# Patient Record
Sex: Female | Born: 1985 | Race: White | Hispanic: No | Smoking: Former smoker
Health system: Southern US, Community
[De-identification: ages and names within clinical notes are randomized; demographics above are authoritative.]

## PROBLEM LIST (undated history)

## (undated) DIAGNOSIS — R12 Heartburn: Secondary | ICD-10-CM

## (undated) DIAGNOSIS — F909 Attention-deficit hyperactivity disorder, unspecified type: Secondary | ICD-10-CM

## (undated) DIAGNOSIS — F32A Depression, unspecified: Secondary | ICD-10-CM

## (undated) DIAGNOSIS — F419 Anxiety disorder, unspecified: Secondary | ICD-10-CM

## (undated) DIAGNOSIS — O26899 Other specified pregnancy related conditions, unspecified trimester: Secondary | ICD-10-CM

## (undated) DIAGNOSIS — F329 Major depressive disorder, single episode, unspecified: Secondary | ICD-10-CM

## (undated) DIAGNOSIS — Z808 Family history of malignant neoplasm of other organs or systems: Secondary | ICD-10-CM

## (undated) DIAGNOSIS — T4145XA Adverse effect of unspecified anesthetic, initial encounter: Secondary | ICD-10-CM

## (undated) DIAGNOSIS — Z1509 Genetic susceptibility to other malignant neoplasm: Secondary | ICD-10-CM

## (undated) DIAGNOSIS — IMO0002 Reserved for concepts with insufficient information to code with codable children: Secondary | ICD-10-CM

## (undated) DIAGNOSIS — Z803 Family history of malignant neoplasm of breast: Secondary | ICD-10-CM

## (undated) DIAGNOSIS — T8859XA Other complications of anesthesia, initial encounter: Secondary | ICD-10-CM

## (undated) HISTORY — DX: Family history of malignant neoplasm of breast: Z80.3

## (undated) HISTORY — DX: Genetic susceptibility to other malignant neoplasm: Z15.09

## (undated) HISTORY — DX: Attention-deficit hyperactivity disorder, unspecified type: F90.9

## (undated) HISTORY — DX: Reserved for concepts with insufficient information to code with codable children: IMO0002

## (undated) HISTORY — DX: Depression, unspecified: F32.A

## (undated) HISTORY — PX: LEEP: SHX91

## (undated) HISTORY — DX: Family history of malignant neoplasm of other organs or systems: Z80.8

## (undated) HISTORY — DX: Major depressive disorder, single episode, unspecified: F32.9

## (undated) HISTORY — PX: CERVICAL CONE BIOPSY: SUR198

---
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
## 2012-05-02 LAB — OB RESULTS CONSOLE TSH: TSH: 0.485

## 2012-05-02 LAB — OB RESULTS CONSOLE ANTIBODY SCREEN: Antibody Screen: NEGATIVE

## 2012-05-02 LAB — OB RESULTS CONSOLE PLATELET COUNT: Platelets: 323 10*3/uL

## 2012-05-02 LAB — OB RESULTS CONSOLE GC/CHLAMYDIA: Gonorrhea: NEGATIVE

## 2012-05-02 LAB — OB RESULTS CONSOLE RUBELLA ANTIBODY, IGM: Rubella: IMMUNE

## 2012-09-12 ENCOUNTER — Encounter: Payer: Self-pay | Admitting: Obstetrics

## 2012-09-12 ENCOUNTER — Encounter: Payer: Self-pay | Admitting: Obstetrics & Gynecology

## 2012-09-12 ENCOUNTER — Ambulatory Visit (INDEPENDENT_AMBULATORY_CARE_PROVIDER_SITE_OTHER): Admitting: Obstetrics

## 2012-09-12 VITALS — BP 118/82 | Temp 98.6°F | Ht 61.0 in | Wt 235.6 lb

## 2012-09-12 DIAGNOSIS — Z3201 Encounter for pregnancy test, result positive: Secondary | ICD-10-CM

## 2012-09-12 DIAGNOSIS — Z3483 Encounter for supervision of other normal pregnancy, third trimester: Secondary | ICD-10-CM

## 2012-09-12 LAB — POCT URINALYSIS DIPSTICK
Ketones, UA: NEGATIVE
Protein, UA: NEGATIVE
Spec Grav, UA: 1.01

## 2012-09-12 NOTE — Progress Notes (Signed)
Pulse- 98 . Subjective:    Kathleen Osborne is being seen today for her first obstetrical visit.  This is not a planned pregnancy. She is at [redacted]w[redacted]d gestation. Her obstetrical history is significant for obesity. Relationship with FOB: spouse, living together. Patient does intend to breast feed. Pregnancy history fully reviewed.  Menstrual History: OB History   Grav Para Term Preterm Abortions TAB SAB Ect Mult Living   2 1 1       1       Menarche age: 27 Patient's last menstrual period was 01/24/2012.    The following portions of the patient's history were reviewed and updated as appropriate: allergies, current medications, past family history, past medical history, past social history, past surgical history and problem list.  Review of Systems Pertinent items are noted in HPI.    Objective:    General appearance: alert and no distress Abdomen: normal findings: soft, non-tender Pelvic: cervix normal in appearance, external genitalia normal, no adnexal masses or tenderness, no cervical motion tenderness, vagina normal without discharge and uterus enlarged, soft, NT.    Assessment:    Pregnancy at [redacted]w[redacted]d weeks    Plan:    Initial labs drawn. Prenatal vitamins. Problem list reviewed and updated. AFP3 discussed: Already done. Role of ultrasound in pregnancy discussed; fetal survey: Done. Amniocentesis discussed: not indicated. Follow up in 2 weeks. 50% of 20 min visit spent on counseling and coordination of care.

## 2012-09-12 NOTE — Addendum Note (Signed)
Addended by: Elby Beck F on: 09/12/2012 11:00 PM   Modules accepted: Orders

## 2012-09-13 ENCOUNTER — Encounter: Payer: Self-pay | Admitting: Obstetrics & Gynecology

## 2012-09-27 ENCOUNTER — Ambulatory Visit (INDEPENDENT_AMBULATORY_CARE_PROVIDER_SITE_OTHER): Admitting: Obstetrics

## 2012-09-27 VITALS — BP 112/75 | Temp 97.9°F | Wt 237.0 lb

## 2012-09-27 DIAGNOSIS — Z3483 Encounter for supervision of other normal pregnancy, third trimester: Secondary | ICD-10-CM

## 2012-09-27 DIAGNOSIS — Z3480 Encounter for supervision of other normal pregnancy, unspecified trimester: Secondary | ICD-10-CM

## 2012-09-27 LAB — POCT URINALYSIS DIPSTICK
Bilirubin, UA: NEGATIVE
Glucose, UA: NEGATIVE
Leukocytes, UA: NEGATIVE
Nitrite, UA: NEGATIVE

## 2012-09-27 NOTE — Progress Notes (Signed)
Pulse 109  Patient states she has no concerns.

## 2012-09-28 ENCOUNTER — Encounter: Payer: Self-pay | Admitting: Obstetrics

## 2012-09-29 LAB — STREP B DNA PROBE: GBSP: NEGATIVE

## 2012-10-04 ENCOUNTER — Ambulatory Visit (INDEPENDENT_AMBULATORY_CARE_PROVIDER_SITE_OTHER): Admitting: Obstetrics

## 2012-10-04 ENCOUNTER — Encounter: Payer: Self-pay | Admitting: Obstetrics

## 2012-10-04 VITALS — BP 133/80 | Temp 97.9°F | Wt 240.0 lb

## 2012-10-04 DIAGNOSIS — Z3480 Encounter for supervision of other normal pregnancy, unspecified trimester: Secondary | ICD-10-CM

## 2012-10-04 DIAGNOSIS — Z3483 Encounter for supervision of other normal pregnancy, third trimester: Secondary | ICD-10-CM

## 2012-10-04 LAB — POCT URINALYSIS DIPSTICK
Glucose, UA: NEGATIVE
Nitrite, UA: NEGATIVE
Protein, UA: NEGATIVE
Urobilinogen, UA: NEGATIVE

## 2012-10-04 NOTE — Progress Notes (Signed)
Pulse-87 No complaints. Pt will be moving back to [place] After delivery.  Questions: Can she travel to [place] 10 days after c-section by car? How often to have p/p follow up appointments?

## 2012-10-11 ENCOUNTER — Encounter: Payer: Self-pay | Admitting: Obstetrics

## 2012-10-11 ENCOUNTER — Encounter: Payer: Self-pay | Admitting: *Deleted

## 2012-10-11 ENCOUNTER — Ambulatory Visit (INDEPENDENT_AMBULATORY_CARE_PROVIDER_SITE_OTHER): Admitting: Obstetrics

## 2012-10-11 VITALS — BP 129/78 | Temp 98.2°F | Wt 239.0 lb

## 2012-10-11 DIAGNOSIS — Z3480 Encounter for supervision of other normal pregnancy, unspecified trimester: Secondary | ICD-10-CM

## 2012-10-11 DIAGNOSIS — Z3483 Encounter for supervision of other normal pregnancy, third trimester: Secondary | ICD-10-CM

## 2012-10-11 LAB — POCT URINALYSIS DIPSTICK
Leukocytes, UA: NEGATIVE
Urobilinogen, UA: NEGATIVE
pH, UA: 7

## 2012-10-11 NOTE — Progress Notes (Signed)
Pulse-101 Pt c/o middle back pain, pt reports history of back injury with last pregnancy.

## 2012-10-19 ENCOUNTER — Ambulatory Visit (INDEPENDENT_AMBULATORY_CARE_PROVIDER_SITE_OTHER): Admitting: Advanced Practice Midwife

## 2012-10-19 ENCOUNTER — Encounter (HOSPITAL_COMMUNITY): Payer: Self-pay

## 2012-10-19 ENCOUNTER — Encounter: Payer: Self-pay | Admitting: Advanced Practice Midwife

## 2012-10-19 ENCOUNTER — Encounter: Payer: Self-pay | Admitting: Obstetrics

## 2012-10-19 VITALS — BP 125/81 | Temp 97.1°F | Wt 241.0 lb

## 2012-10-19 DIAGNOSIS — Z98891 History of uterine scar from previous surgery: Secondary | ICD-10-CM | POA: Insufficient documentation

## 2012-10-19 DIAGNOSIS — Z3483 Encounter for supervision of other normal pregnancy, third trimester: Secondary | ICD-10-CM

## 2012-10-19 DIAGNOSIS — Z3480 Encounter for supervision of other normal pregnancy, unspecified trimester: Secondary | ICD-10-CM

## 2012-10-19 LAB — POCT URINALYSIS DIPSTICK
Bilirubin, UA: NEGATIVE
Blood, UA: NEGATIVE
Glucose, UA: NEGATIVE
Ketones, UA: NEGATIVE
Spec Grav, UA: <=1.005
pH, UA: 8

## 2012-10-19 NOTE — Progress Notes (Signed)
Routine Obstetrical Visit  Subjective:    Kathleen Osborne is being seen today for her routine obstetrical visit. She is at [redacted]w[redacted]d gestation.   Patient reports no complaints.   Objective:     BP 125/81  Temp(Src) 97.1 F (36.2 C)  Wt 241 lb (109.317 kg)  BMI 45.56 kg/m2  LMP 01/24/2012 FHR 140 FH 39 Cephalic    Assessment:    Pregnancy: G2P1001 Patient Active Problem List   Diagnosis Date Noted  . Supervision of other normal pregnancy 10/19/2012    History of c-section, desires repeat LTCS   Plan:     Prenatal vitamins. Problem list reviewed and updated. Schedule repeat c-section today   [facility], [place] 10/19/2012

## 2012-10-19 NOTE — Progress Notes (Signed)
Pulse: 110 

## 2012-10-21 ENCOUNTER — Other Ambulatory Visit: Payer: Self-pay | Admitting: *Deleted

## 2012-10-22 ENCOUNTER — Encounter: Payer: Self-pay | Admitting: Obstetrics

## 2012-10-23 ENCOUNTER — Encounter (HOSPITAL_COMMUNITY)
Admission: RE | Admit: 2012-10-23 | Discharge: 2012-10-23 | Disposition: A | Discharge status: Home / Self Care | Source: Ambulatory Visit | Attending: Obstetrics | Admitting: Obstetrics

## 2012-10-23 ENCOUNTER — Encounter (HOSPITAL_COMMUNITY): Payer: Self-pay

## 2012-10-23 DIAGNOSIS — Z01812 Encounter for preprocedural laboratory examination: Secondary | ICD-10-CM | POA: Insufficient documentation

## 2012-10-23 DIAGNOSIS — Z01818 Encounter for other preprocedural examination: Secondary | ICD-10-CM | POA: Insufficient documentation

## 2012-10-23 DIAGNOSIS — O34219 Maternal care for unspecified type scar from previous cesarean delivery: Principal | ICD-10-CM | POA: Diagnosis present

## 2012-10-23 HISTORY — DX: Anxiety disorder, unspecified: F41.9

## 2012-10-23 HISTORY — DX: Heartburn: R12

## 2012-10-23 HISTORY — DX: Other specified pregnancy related conditions, unspecified trimester: O26.899

## 2012-10-23 LAB — RPR: RPR Ser Ql: NONREACTIVE

## 2012-10-23 LAB — CBC
Platelets: 215 10*3/uL (ref 150–400)
RBC: 4.38 MIL/uL (ref 3.87–5.11)
WBC: 11.1 10*3/uL — ABNORMAL HIGH (ref 4.0–10.5)

## 2012-10-23 MED ORDER — CEFAZOLIN SODIUM-DEXTROSE 2-3 GM-% IV SOLR
2.0000 g | INTRAVENOUS | Status: AC
  Administered 2012-10-24: 2 g via INTRAVENOUS

## 2012-10-23 NOTE — Patient Instructions (Addendum)
Your procedure is scheduled on:10/24/12  Enter through the Main Entrance at :0715 am Pick up desk phone and dial [phone] and inform us of your arrival.  Please call [phone] if you have any problems the morning of surgery.  Remember: Do not eat food or drink liquids, including water, after midnight:tonight    You may brush your teeth the morning of surgery.    DO NOT wear jewelry, eye make-up, lipstick,body lotion, or dark fingernail polish.  (Polished toes are ok) You may wear deodorant.  If you are to be admitted after surgery, leave suitcase in car until your room has been assigned. Patients discharged on the day of surgery will not be allowed to drive home. Wear loose fitting, comfortable clothes for your ride home.

## 2012-10-24 ENCOUNTER — Encounter (HOSPITAL_COMMUNITY): Payer: Self-pay | Admitting: Anesthesiology

## 2012-10-24 ENCOUNTER — Inpatient Hospital Stay (HOSPITAL_COMMUNITY): Admitting: Anesthesiology

## 2012-10-24 ENCOUNTER — Encounter (HOSPITAL_COMMUNITY)
Admission: AD | Disposition: A | Discharge status: Home / Self Care | Payer: Self-pay | Source: Ambulatory Visit | Attending: Obstetrics

## 2012-10-24 ENCOUNTER — Inpatient Hospital Stay (HOSPITAL_COMMUNITY)
Admission: AD | Admit: 2012-10-24 | Discharge: 2012-10-27 | DRG: 766 | Disposition: A | Discharge status: Home / Self Care | Source: Ambulatory Visit | Attending: Obstetrics | Admitting: Obstetrics

## 2012-10-24 DIAGNOSIS — O34219 Maternal care for unspecified type scar from previous cesarean delivery: Secondary | ICD-10-CM | POA: Diagnosis present

## 2012-10-24 DIAGNOSIS — O479 False labor, unspecified: Secondary | ICD-10-CM | POA: Diagnosis present

## 2012-10-24 DIAGNOSIS — Z98891 History of uterine scar from previous surgery: Secondary | ICD-10-CM

## 2012-10-24 LAB — TYPE AND SCREEN: Antibody Screen: NEGATIVE

## 2012-10-24 SURGERY — Surgical Case
Anesthesia: Spinal | Site: Abdomen | Wound class: Clean Contaminated

## 2012-10-24 MED ORDER — ONDANSETRON HCL 4 MG/2ML IJ SOLN: 4.0000 mg | Freq: Three times a day (TID) | INTRAMUSCULAR | Status: DC | PRN

## 2012-10-24 MED ORDER — ONDANSETRON HCL 4 MG PO TABS: 4.0000 mg | ORAL_TABLET | ORAL | Status: DC | PRN

## 2012-10-24 MED ORDER — MORPHINE SULFATE 0.5 MG/ML IJ SOLN
INTRAMUSCULAR | Status: AC
  Filled 2012-10-24: qty 10

## 2012-10-24 MED ORDER — FENTANYL CITRATE 0.05 MG/ML IJ SOLN: 25.0000 ug | INTRAMUSCULAR | Status: DC | PRN

## 2012-10-24 MED ORDER — BUPIVACAINE IN DEXTROSE 0.75-8.25 % IT SOLN
INTRATHECAL | Status: DC | PRN
  Administered 2012-10-24: 1.2 mL via INTRATHECAL

## 2012-10-24 MED ORDER — EPHEDRINE 5 MG/ML INJ
INTRAVENOUS | Status: AC
  Filled 2012-10-24: qty 10

## 2012-10-24 MED ORDER — NALBUPHINE SYRINGE 5 MG/0.5 ML
5.0000 mg | INJECTION | INTRAMUSCULAR | Status: DC | PRN
  Filled 2012-10-24: qty 1

## 2012-10-24 MED ORDER — ONDANSETRON HCL 4 MG/2ML IJ SOLN: 4.0000 mg | INTRAMUSCULAR | Status: DC | PRN

## 2012-10-24 MED ORDER — OXYCODONE-ACETAMINOPHEN 5-325 MG PO TABS
1.0000 | ORAL_TABLET | ORAL | Status: DC | PRN
  Administered 2012-10-24 – 2012-10-25 (×3): 2 via ORAL
  Administered 2012-10-26 – 2012-10-27 (×7): 1 via ORAL
  Filled 2012-10-24 (×2): qty 1
  Filled 2012-10-24: qty 2
  Filled 2012-10-24: qty 1
  Filled 2012-10-24: qty 2
  Filled 2012-10-24: qty 1
  Filled 2012-10-24: qty 2
  Filled 2012-10-24 (×3): qty 1

## 2012-10-24 MED ORDER — SENNOSIDES-DOCUSATE SODIUM 8.6-50 MG PO TABS
2.0000 | ORAL_TABLET | Freq: Every day | ORAL | Status: DC
  Administered 2012-10-24 – 2012-10-26 (×3): 2 via ORAL

## 2012-10-24 MED ORDER — FENTANYL CITRATE 0.05 MG/ML IJ SOLN
INTRAMUSCULAR | Status: AC
  Filled 2012-10-24: qty 2

## 2012-10-24 MED ORDER — KETOROLAC TROMETHAMINE 30 MG/ML IJ SOLN: 30.0000 mg | Freq: Four times a day (QID) | INTRAMUSCULAR | Status: AC | PRN

## 2012-10-24 MED ORDER — KETOROLAC TROMETHAMINE 60 MG/2ML IM SOLN
60.0000 mg | Freq: Once | INTRAMUSCULAR | Status: AC | PRN
  Administered 2012-10-24: 60 mg via INTRAMUSCULAR

## 2012-10-24 MED ORDER — METOCLOPRAMIDE HCL 5 MG/ML IJ SOLN: 10.0000 mg | Freq: Three times a day (TID) | INTRAMUSCULAR | Status: DC | PRN

## 2012-10-24 MED ORDER — SCOPOLAMINE 1 MG/3DAYS TD PT72
MEDICATED_PATCH | TRANSDERMAL | Status: AC
  Administered 2012-10-24: 1.5 mg via TRANSDERMAL
  Filled 2012-10-24: qty 1

## 2012-10-24 MED ORDER — MORPHINE SULFATE (PF) 0.5 MG/ML IJ SOLN
INTRAMUSCULAR | Status: DC | PRN
  Administered 2012-10-24: .1 mg via INTRATHECAL

## 2012-10-24 MED ORDER — EPHEDRINE SULFATE 50 MG/ML IJ SOLN
INTRAMUSCULAR | Status: DC | PRN
  Administered 2012-10-24 (×10): 10 mg via INTRAVENOUS

## 2012-10-24 MED ORDER — ONDANSETRON HCL 4 MG/2ML IJ SOLN
INTRAMUSCULAR | Status: AC
  Filled 2012-10-24: qty 2

## 2012-10-24 MED ORDER — DIBUCAINE 1 % RE OINT: 1.0000 "application " | TOPICAL_OINTMENT | RECTAL | Status: DC | PRN

## 2012-10-24 MED ORDER — MEASLES, MUMPS & RUBELLA VAC ~~LOC~~ INJ
0.5000 mL | INJECTION | Freq: Once | SUBCUTANEOUS | Status: DC
  Filled 2012-10-24: qty 0.5

## 2012-10-24 MED ORDER — LANOLIN HYDROUS EX OINT: 1.0000 "application " | TOPICAL_OINTMENT | CUTANEOUS | Status: DC | PRN

## 2012-10-24 MED ORDER — KETOROLAC TROMETHAMINE 30 MG/ML IJ SOLN
INTRAMUSCULAR | Status: AC
  Filled 2012-10-24: qty 1

## 2012-10-24 MED ORDER — FERROUS SULFATE 325 (65 FE) MG PO TABS
325.0000 mg | ORAL_TABLET | Freq: Two times a day (BID) | ORAL | Status: DC
  Administered 2012-10-24 – 2012-10-27 (×6): 325 mg via ORAL
  Filled 2012-10-24 (×6): qty 1

## 2012-10-24 MED ORDER — WITCH HAZEL-GLYCERIN EX PADS: 1.0000 "application " | MEDICATED_PAD | CUTANEOUS | Status: DC | PRN

## 2012-10-24 MED ORDER — EPHEDRINE 5 MG/ML INJ
INTRAVENOUS | Status: AC
  Filled 2012-10-24: qty 20

## 2012-10-24 MED ORDER — OXYTOCIN 40 UNITS IN LACTATED RINGERS INFUSION - SIMPLE MED: 62.5000 mL/h | INTRAVENOUS | Status: AC

## 2012-10-24 MED ORDER — SCOPOLAMINE 1 MG/3DAYS TD PT72: 1.0000 | MEDICATED_PATCH | Freq: Once | TRANSDERMAL | Status: DC

## 2012-10-24 MED ORDER — FENTANYL CITRATE 0.05 MG/ML IJ SOLN
INTRAMUSCULAR | Status: DC | PRN
  Administered 2012-10-24: 15 ug via INTRATHECAL

## 2012-10-24 MED ORDER — MEPERIDINE HCL 25 MG/ML IJ SOLN: 6.2500 mg | INTRAMUSCULAR | Status: DC | PRN

## 2012-10-24 MED ORDER — PHENYLEPHRINE 40 MCG/ML (10ML) SYRINGE FOR IV PUSH (FOR BLOOD PRESSURE SUPPORT)
PREFILLED_SYRINGE | INTRAVENOUS | Status: AC
  Filled 2012-10-24: qty 5

## 2012-10-24 MED ORDER — DIPHENHYDRAMINE HCL 50 MG/ML IJ SOLN: 25.0000 mg | INTRAMUSCULAR | Status: DC | PRN

## 2012-10-24 MED ORDER — SIMETHICONE 80 MG PO CHEW
80.0000 mg | CHEWABLE_TABLET | ORAL | Status: DC | PRN
  Administered 2012-10-24 – 2012-10-27 (×4): 80 mg via ORAL

## 2012-10-24 MED ORDER — MAGNESIUM HYDROXIDE 400 MG/5ML PO SUSP: 30.0000 mL | ORAL | Status: DC | PRN

## 2012-10-24 MED ORDER — NALOXONE HCL 1 MG/ML IJ SOLN
1.0000 ug/kg/h | INTRAVENOUS | Status: DC | PRN
  Filled 2012-10-24: qty 2

## 2012-10-24 MED ORDER — KETOROLAC TROMETHAMINE 60 MG/2ML IM SOLN
INTRAMUSCULAR | Status: AC
  Filled 2012-10-24: qty 2

## 2012-10-24 MED ORDER — IBUPROFEN 600 MG PO TABS
600.0000 mg | ORAL_TABLET | Freq: Four times a day (QID) | ORAL | Status: DC
  Administered 2012-10-24 – 2012-10-27 (×11): 600 mg via ORAL
  Filled 2012-10-24 (×13): qty 1

## 2012-10-24 MED ORDER — LACTATED RINGERS IV SOLN
Freq: Once | INTRAVENOUS | Status: AC
  Administered 2012-10-24 (×3): via INTRAVENOUS

## 2012-10-24 MED ORDER — OXYTOCIN 10 UNIT/ML IJ SOLN
40.0000 [IU] | INTRAVENOUS | Status: DC | PRN
  Administered 2012-10-24: 40 [IU] via INTRAVENOUS

## 2012-10-24 MED ORDER — DIPHENHYDRAMINE HCL 25 MG PO CAPS
25.0000 mg | ORAL_CAPSULE | ORAL | Status: DC | PRN
  Filled 2012-10-24: qty 1

## 2012-10-24 MED ORDER — LACTATED RINGERS IV SOLN
INTRAVENOUS | Status: DC
  Administered 2012-10-24 (×2): via INTRAVENOUS

## 2012-10-24 MED ORDER — ONDANSETRON HCL 4 MG/2ML IJ SOLN
INTRAMUSCULAR | Status: DC | PRN
  Administered 2012-10-24: 4 mg via INTRAVENOUS

## 2012-10-24 MED ORDER — ZOLPIDEM TARTRATE 5 MG PO TABS: 5.0000 mg | ORAL_TABLET | Freq: Every evening | ORAL | Status: DC | PRN

## 2012-10-24 MED ORDER — OXYTOCIN 10 UNIT/ML IJ SOLN
INTRAMUSCULAR | Status: AC
  Filled 2012-10-24: qty 2

## 2012-10-24 MED ORDER — DIPHENHYDRAMINE HCL 25 MG PO CAPS: 25.0000 mg | ORAL_CAPSULE | Freq: Four times a day (QID) | ORAL | Status: DC | PRN

## 2012-10-24 MED ORDER — DIPHENHYDRAMINE HCL 50 MG/ML IJ SOLN: 12.5000 mg | INTRAMUSCULAR | Status: DC | PRN

## 2012-10-24 MED ORDER — LACTATED RINGERS IV SOLN
INTRAVENOUS | Status: DC
  Administered 2012-10-24: 18:00:00 via INTRAVENOUS

## 2012-10-24 MED ORDER — NALOXONE HCL 0.4 MG/ML IJ SOLN: 0.4000 mg | INTRAMUSCULAR | Status: DC | PRN

## 2012-10-24 MED ORDER — SODIUM CHLORIDE 0.9 % IJ SOLN: 3.0000 mL | INTRAMUSCULAR | Status: DC | PRN

## 2012-10-24 MED ORDER — PRENATAL MULTIVITAMIN CH
1.0000 | ORAL_TABLET | Freq: Every day | ORAL | Status: DC
  Administered 2012-10-25 – 2012-10-27 (×3): 1 via ORAL
  Filled 2012-10-24 (×4): qty 1

## 2012-10-24 MED ORDER — TETANUS-DIPHTH-ACELL PERTUSSIS 5-2.5-18.5 LF-MCG/0.5 IM SUSP: 0.5000 mL | Freq: Once | INTRAMUSCULAR | Status: DC

## 2012-10-24 MED ORDER — CEFAZOLIN SODIUM-DEXTROSE 2-3 GM-% IV SOLR
INTRAVENOUS | Status: AC
  Filled 2012-10-24: qty 50

## 2012-10-24 SURGICAL SUPPLY — 43 items
ADH SKN CLS APL DERMABOND .7 (GAUZE/BANDAGES/DRESSINGS) ×1
CANISTER WOUND CARE 500ML ATS (WOUND CARE) IMPLANT
CLAMP CORD UMBIL (MISCELLANEOUS) IMPLANT
CLOTH BEACON ORANGE TIMEOUT ST (SAFETY) ×2 IMPLANT
CONTAINER PREFILL 10% NBF 15ML (MISCELLANEOUS) ×4 IMPLANT
DERMABOND ADVANCED (GAUZE/BANDAGES/DRESSINGS) ×1
DERMABOND ADVANCED .7 DNX12 (GAUZE/BANDAGES/DRESSINGS) ×1 IMPLANT
DRAPE LG THREE QUARTER DISP (DRAPES) ×2 IMPLANT
DRSG OPSITE POSTOP 4X10 (GAUZE/BANDAGES/DRESSINGS) ×2 IMPLANT
DRSG VAC ATS LRG SENSATRAC (GAUZE/BANDAGES/DRESSINGS) IMPLANT
DRSG VAC ATS MED SENSATRAC (GAUZE/BANDAGES/DRESSINGS) IMPLANT
DRSG VAC ATS SM SENSATRAC (GAUZE/BANDAGES/DRESSINGS) IMPLANT
DURAPREP 26ML APPLICATOR (WOUND CARE) ×2 IMPLANT
ELECT REM PT RETURN 9FT ADLT (ELECTROSURGICAL) ×2
ELECTRODE REM PT RTRN 9FT ADLT (ELECTROSURGICAL) ×1 IMPLANT
EXTRACTOR VACUUM M CUP 4 TUBE (SUCTIONS) IMPLANT
GLOVE BIO SURGEON STRL SZ8 (GLOVE) ×4 IMPLANT
GOWN PREVENTION PLUS XLARGE (GOWN DISPOSABLE) ×2 IMPLANT
GOWN STRL REIN XL XLG (GOWN DISPOSABLE) ×4 IMPLANT
KIT ABG SYR 3ML LUER SLIP (SYRINGE) IMPLANT
NDL HYPO 25X5/8 SAFETYGLIDE (NEEDLE) ×1 IMPLANT
NEEDLE HYPO 25X5/8 SAFETYGLIDE (NEEDLE) ×2 IMPLANT
NS IRRIG 1000ML POUR BTL (IV SOLUTION) ×2 IMPLANT
PACK C SECTION WH (CUSTOM PROCEDURE TRAY) ×2 IMPLANT
PAD OB MATERNITY 4.3X12.25 (PERSONAL CARE ITEMS) ×2 IMPLANT
RTRCTR C-SECT PINK 25CM LRG (MISCELLANEOUS) ×2 IMPLANT
STAPLER VISISTAT 35W (STAPLE) IMPLANT
SUT GUT PLAIN 0 CT-3 TAN 27 (SUTURE) IMPLANT
SUT MNCRL 0 VIOLET CTX 36 (SUTURE) ×3 IMPLANT
SUT MNCRL AB 4-0 PS2 18 (SUTURE) IMPLANT
SUT MON AB 2-0 CT1 27 (SUTURE) ×2 IMPLANT
SUT MON AB 3-0 SH 27 (SUTURE)
SUT MON AB 3-0 SH27 (SUTURE) IMPLANT
SUT MONOCRYL 0 CTX 36 (SUTURE) ×3
SUT PDS AB 0 CTX 60 (SUTURE) IMPLANT
SUT PLAIN 2 0 XLH (SUTURE) IMPLANT
SUT VIC AB 0 CTX 36 (SUTURE)
SUT VIC AB 0 CTX36XBRD ANBCTRL (SUTURE) IMPLANT
SUT VIC AB 2-0 CT1 27 (SUTURE)
SUT VIC AB 2-0 CT1 TAPERPNT 27 (SUTURE) IMPLANT
TOWEL OR 17X24 6PK STRL BLUE (TOWEL DISPOSABLE) ×2 IMPLANT
TRAY FOLEY CATH 14FR (SET/KITS/TRAYS/PACK) ×2 IMPLANT
WATER STERILE IRR 1000ML POUR (IV SOLUTION) ×2 IMPLANT

## 2012-10-24 NOTE — Anesthesia Postprocedure Evaluation (Signed)
  Anesthesia Post Note  Patient: Kathleen Osborne  Procedure(s) Performed: Procedure(s) (LRB): REPEAT CESAREAN SECTION (N/A)  Anesthesia type: Spinal  Patient location: PACU  Post pain: Pain level controlled  Post assessment: Post-op Vital signs reviewed  Last Vitals:  Filed Vitals:   10/24/12 1018  BP: 126/100  Pulse:   Temp: 36.9 C  Resp: 16    Post vital signs: Reviewed  Level of consciousness: awake  Complications: No apparent anesthesia complications

## 2012-10-24 NOTE — Anesthesia Procedure Notes (Signed)
Spinal  Patient location during procedure: OR Start time: 10/24/2012 9:06 AM Staffing Performed by: anesthesiologist  Preanesthetic Checklist Completed: patient identified, site marked, surgical consent, pre-op evaluation, timeout performed, IV checked, risks and benefits discussed and monitors and equipment checked Spinal Block Patient position: sitting Prep: site prepped and draped and DuraPrep Patient monitoring: heart rate, cardiac monitor, continuous pulse ox and blood pressure Approach: midline Location: L3-4 Injection technique: single-shot Needle Needle type: Sprotte  Needle gauge: 24 G Needle length: 9 cm Assessment Sensory level: T4 Additional Notes Clear free flow CSF on first attempt.  No paresthesia.  Patient tolerated procedure well with no apparent complications. Jasmine December, MD

## 2012-10-24 NOTE — H&P (Signed)
Kathleen Osborne is a 27 y.o. female presenting for repeat C/S. Maternal Medical History:  Reason for admission: 27 yo G2 P1.  EDC 10-29-12.  Presents for repeat C/S.  Fetal activity: Perceived fetal activity is normal.   Last perceived fetal movement was within the past hour.    Prenatal Complications - Diabetes: none.    OB History   Grav Para Term Preterm Abortions TAB SAB Ect Mult Living   2 1 1       1      Past Medical History  Diagnosis Date  . Heartburn in pregnancy   . Anxiety     no meds while pregnant   Past Surgical History  Procedure Laterality Date  . Cervical cone biopsy      2006  . Cesarean section    . Leep     Family History: family history includes Cancer in her maternal grandmother; Hyperlipidemia in her father; Melanoma in her mother; Parkinson's disease in her maternal grandfather. Social History:  reports that she has never smoked. She does not have any smokeless tobacco history on file. She reports that she does not drink alcohol or use illicit drugs.   Prenatal Transfer Tool  Maternal Diabetes: No Genetic Screening: Normal Maternal Ultrasounds/Referrals: Normal Fetal Ultrasounds or other Referrals:  None Maternal Substance Abuse:  No Significant Maternal Medications:  None Significant Maternal Lab Results:  None Other Comments:  None  Review of Systems  All other systems reviewed and are negative.      Blood pressure 132/89, pulse 112, temperature 98.6 F (37 C), temperature source Oral, last menstrual period 01/24/2012, SpO2 99.00%. Maternal Exam:  Abdomen: Patient reports no abdominal tenderness.   Physical Exam  Nursing note and vitals reviewed. Constitutional: She is oriented to person, place, and time. She appears well-developed and well-nourished.  HENT:  Head: Normocephalic and atraumatic.  Eyes: Conjunctivae are normal. Pupils are equal, round, and reactive to light.  Neck: Normal range of motion. Neck supple.   Cardiovascular: Normal rate and regular rhythm.   Respiratory: Effort normal.  GI: Soft.  Musculoskeletal: Normal range of motion.  Neurological: She is alert and oriented to person, place, and time.  Skin: Skin is warm and dry.  Psychiatric: She has a normal mood and affect. Her behavior is normal. Judgment and thought content normal.    Prenatal labs: ABO, Rh: A/Positive/-- (02/19 0000) Antibody: Negative (02/19 0000) Rubella: Immune (02/19 0000) RPR: NON REACTIVE (08/12 1232)  HBsAg: Negative (02/19 0000)  HIV: Non-reactive (02/19 0000)  GBS: NEGATIVE (07/17 1219)   Assessment/Plan: 39 weeks.  Desires repeat C/S.   HARPER,CHARLES A 10/24/2012, 8:27 AM

## 2012-10-24 NOTE — Transfer of Care (Signed)
Immediate Anesthesia Transfer of Care Note  Patient: Kathleen Osborne  Procedure(s) Performed: Procedure(s): REPEAT CESAREAN SECTION (N/A)  Patient Location: PACU  Anesthesia Type:Spinal  Level of Consciousness: awake, alert  and oriented  Airway & Oxygen Therapy: Patient Spontanous Breathing  Post-op Assessment: Report given to PACU RN and Post -op Vital signs reviewed and stable  Post vital signs: Reviewed and stable  Complications: No apparent anesthesia complications

## 2012-10-24 NOTE — Anesthesia Preprocedure Evaluation (Signed)
Anesthesia Evaluation  Patient identified by MRN, date of birth, ID band Patient awake    Reviewed: Allergy & Precautions, H&P , NPO status , Patient's Chart, lab work & pertinent test results  Airway Mallampati: III TM Distance: >3 FB Neck ROM: Full    Dental no notable dental hx. (+) Teeth Intact   Pulmonary neg pulmonary ROS,  breath sounds clear to auscultation  Pulmonary exam normal       Cardiovascular negative cardio ROS  Rhythm:Regular Rate:Normal     Neuro/Psych negative neurological ROS  negative psych ROS   GI/Hepatic negative GI ROS, Neg liver ROS, GERD-  Medicated and Controlled,  Endo/Other  Morbid obesity  Renal/GU negative Renal ROS  negative genitourinary   Musculoskeletal negative musculoskeletal ROS (+)   Abdominal (+) + obese,   Peds  Hematology negative hematology ROS (+)   Anesthesia Other Findings   Reproductive/Obstetrics (+) Pregnancy Previous C/Section                           Anesthesia Physical Anesthesia Plan  ASA: III  Anesthesia Plan: Spinal   Post-op Pain Management:    Induction:   Airway Management Planned: Natural Airway  Additional Equipment:   Intra-op Plan:   Post-operative Plan:   Informed Consent: I have reviewed the patients History and Physical, chart, labs and discussed the procedure including the risks, benefits and alternatives for the proposed anesthesia with the patient or authorized representative who has indicated his/her understanding and acceptance.   Dental advisory given  Plan Discussed with: CRNA, Anesthesiologist and Surgeon  Anesthesia Plan Comments:         Anesthesia Quick Evaluation

## 2012-10-24 NOTE — Op Note (Signed)
Cesarean Section Procedure Note   ANNASOFIA POHL   10/24/2012  Indications: Scheduled Proceedure/Maternal Request   Pre-operative Diagnosis: previous cesarean section.   Post-operative Diagnosis: Same   Surgeon: Coral Ceo A  Assistants: Tamela Oddi, Misty Stanley  Anesthesia: spinal  Procedure Details:  The patient was seen in the Holding Room. The risks, benefits, complications, treatment options, and expected outcomes were discussed with the patient. The patient concurred with the proposed plan, giving informed consent. The patient was identified as [name] and the procedure verified as C-Section Delivery. A Time Out was held and the above information confirmed.  After induction of anesthesia, the patient was draped and prepped in the usual sterile manner. A transverse incision was made and carried down through the subcutaneous tissue to the fascia. The fascial incision was made and extended transversely. The fascia was separated from the underlying rectus tissue superiorly and inferiorly. The peritoneum was identified and entered. The peritoneal incision was extended longitudinally. The utero-vesical peritoneal reflection was incised transversely and the bladder flap was bluntly freed from the lower uterine segment. A low transverse uterine incision was made. Delivered from cephalic presentation was a 3245 gram living newborn female infant(s). APGAR (1 MIN): 5   APGAR (5 MINS): 8   APGAR (10 MINS):    A cord ph was not sent. The umbilical cord was clamped and cut cord. A sample was obtained for evaluation. The placenta was removed Intact and appeared normal.  The uterine incision was closed with running locked sutures of 1-0 Monocryl. A second imbricating layer of the same suture was placed.  Hemostasis was observed. The paracolic gutters were irrigated. The parieto peritoneum was closed in a running fashion with 2-0 Vicryl.  The fascia was then reapproximated with running sutures of 0  PDS.  The skin was closed with staples.  Instrument, sponge, and needle counts were correct prior the abdominal closure and were correct at the conclusion of the case.    Findings:   Estimated Blood Loss:  Total IV Fluids:   Urine Output: 200CC OF clear urine  Specimens: Placenta  Complications: no complications  Disposition: PACU - hemodynamically stable.  Maternal Condition: stable   Baby condition / location:  nursery-stable    Signed: Surgeon(s): Brock Bad, MD Antionette Char, MD

## 2012-10-24 NOTE — Anesthesia Postprocedure Evaluation (Signed)
Anesthesia Post Note  Patient: Kathleen Osborne  Procedure(s) Performed: Procedure(s) (LRB): REPEAT CESAREAN SECTION (N/A)  Anesthesia type: SAB  Patient location: Mother/Baby  Post pain: Pain level controlled  Post assessment: Post-op Vital signs reviewed  Last Vitals:  Filed Vitals:   10/24/12 1415  BP: 106/65  Pulse: 95  Temp:   Resp: 18    Post vital signs: Reviewed  Level of consciousness: awake  Complications: No apparent anesthesia complications

## 2012-10-24 NOTE — Lactation Note (Signed)
This note was copied from the chart of Kathleen Lanessa Shill. Lactation Consultation Note  Patient Name: Kathleen Osborne [id number] Date: 10/24/2012 Reason for consult: Initial assessment   Maternal Data Formula Feeding for Exclusion: No Infant to breast within first hour of birth: Yes Does the patient have breastfeeding experience prior to this delivery?: Yes  Feeding Feeding Type: Breast Milk  LATCH Score/Interventions Latch: Grasps breast easily, tongue down, lips flanged, rhythmical sucking.  Audible Swallowing: A few with stimulation  Type of Nipple: Everted at rest and after stimulation  Comfort (Breast/Nipple): Soft / non-tender     Hold (Positioning): Assistance needed to correctly position infant at breast and maintain latch.  LATCH Score: 8  Lactation Tools Discussed/Used     Consult Status Consult Status: Follow-up  Experienced Bf mom- assisted in PACU with latch. Baby alert and rooting. Latched well. No questions at present. To call for assist prn. BF brochure left with mom with resources for support after DC. Pamelia Hoit 10/24/2012, 10:52 AM

## 2012-10-25 ENCOUNTER — Encounter (HOSPITAL_COMMUNITY): Payer: Self-pay | Admitting: Obstetrics

## 2012-10-25 ENCOUNTER — Encounter (HOSPITAL_COMMUNITY)
Admission: AD | Disposition: A | Discharge status: Home / Self Care | Payer: Self-pay | Source: Ambulatory Visit | Attending: Obstetrics

## 2012-10-25 LAB — CBC
MCV: 81.6 fL (ref 78.0–100.0)
Platelets: 180 10*3/uL (ref 150–400)
RBC: 3.47 MIL/uL — ABNORMAL LOW (ref 3.87–5.11)
RDW: 16.4 % — ABNORMAL HIGH (ref 11.5–15.5)
WBC: 9.9 10*3/uL (ref 4.0–10.5)

## 2012-10-25 SURGERY — Surgical Case
Anesthesia: Regional

## 2012-10-25 NOTE — Progress Notes (Signed)
Subjective: Postpartum Day 1: Cesarean Delivery Patient reports tolerating PO.    Objective: Vital signs in last 24 hours: Temp:  [97 F (36.1 C)-99.2 F (37.3 C)] 98.6 F (37 C) (08/14 0552) Pulse Rate:  [73-95] 82 (08/14 0552) Resp:  [16-25] 18 (08/14 0552) BP: (98-133)/(37-100) 112/73 mmHg (08/14 0552) SpO2:  [96 %-99 %] 98 % (08/14 0552) Weight:  [242 lb (109.77 kg)] 242 lb (109.77 kg) (08/13 1058)  Physical Exam:  General: alert and no distress Lochia: appropriate Uterine Fundus: firm Incision: healing well DVT Evaluation: No evidence of DVT seen on physical exam.   Recent Labs  10/23/12 1232 10/25/12 0559  HGB 11.5* 9.2*  HCT 35.1* 28.3*    Assessment/Plan: Status post Cesarean section. Doing well postoperatively.  Continue current care.  Dawnisha Marquina A 10/25/2012, 8:28 AM

## 2012-10-25 NOTE — Lactation Note (Signed)
This note was copied from the chart of Kathleen Darrah Dredge. Lactation Consultation Note: follow up visit with mom, She reports that baby has been nursing well. Baby is asleep in mom's arms. Asking about pump rental, Will be here for 10 days and wants to go on date with husband. Suggested calling insurance company to see if they would cover it. Suggested manual pump and mom agrees. Manual pump given with instructions for use and cleaning. No questions at present. To call prn  Patient Name: Kathleen Osborne [id number] Date: 10/25/2012 Reason for consult: Follow-up assessment   Maternal Data    Feeding Feeding Type: Breast Milk   LATCH Score/Interventions   Lactation Tools Discussed/Used     Consult Status Consult Status: PRN    Pamelia Hoit 10/25/2012, 1:41 PM

## 2012-10-26 NOTE — Progress Notes (Signed)
Subjective: Postpartum Day 2: Cesarean Delivery Patient reports tolerating PO, + flatus and no problems voiding.    Objective: Vital signs in last 24 hours: Temp:  [98.1 F (36.7 C)-98.3 F (36.8 C)] 98.2 F (36.8 C) (08/15 0606) Pulse Rate:  [83-98] 83 (08/15 0606) Resp:  [18-20] 20 (08/15 0606) BP: (117-126)/(67-77) 117/77 mmHg (08/15 0606) SpO2:  [98 %] 98 % (08/14 0930)  Physical Exam:  General: alert and no distress Lochia: appropriate Uterine Fundus: firm Incision: healing well DVT Evaluation: No evidence of DVT seen on physical exam.   Recent Labs  10/23/12 1232 10/25/12 0559  HGB 11.5* 9.2*  HCT 35.1* 28.3*    Assessment/Plan: Status post Cesarean section. Doing well postoperatively.  Continue current care.  Jailen Lung A 10/26/2012, 8:25 AM

## 2012-10-27 NOTE — Discharge Summary (Signed)
Obstetric Discharge Summary Reason for Admission: onset of labor Prenatal Procedures: none Intrapartum Procedures: cesarean: low cervical, transverse Postpartum Procedures: none Complications-Operative and Postpartum: none Hemoglobin  Date Value Range Status  10/25/2012 9.2* 12.0 - 15.0 g/dL Final     DELTA CHECK NOTED     REPEATED TO VERIFY  05/02/2012 12.7   Final     HCT  Date Value Range Status  10/25/2012 28.3* 36.0 - 46.0 % Final  05/02/2012 39   Final    Physical Exam:  General: alert Lochia: appropriate Uterine Fundus: firm Incision: healing well DVT Evaluation: No evidence of DVT seen on physical exam.  Discharge Diagnoses: Term Pregnancy-delivered  Discharge Information: Date: 10/27/2012 Activity: pelvic rest Diet: routine Medications: Percocet Condition: stable Instructions: refer to practice specific booklet Discharge to: home Follow-up Information   Follow up with HARPER,CHARLES A, MD.   Specialty:  Obstetrics and Gynecology   Contact information:   [address] [place] [address] [phone]       Newborn Data: Live born female  Birth Weight: 7 lb 2.5 oz (3245 g) APGAR: 5, 8  Home with mother.  Idris Edmundson A 10/27/2012, 6:46 AM

## 2012-10-31 ENCOUNTER — Ambulatory Visit (INDEPENDENT_AMBULATORY_CARE_PROVIDER_SITE_OTHER): Admitting: Obstetrics

## 2012-10-31 ENCOUNTER — Encounter: Payer: Self-pay | Admitting: Obstetrics

## 2012-10-31 NOTE — Progress Notes (Signed)
.   Subjective:     Kathleen Osborne is a 27 y.o. female who presents for a postpartum visit. She is 1 week postpartum following a low cervical transverse Cesarean section. I have fully reviewed the prenatal and intrapartum course. The delivery was at 39 gestational weeks. Outcome: repeat cesarean section, low transverse incision. Anesthesia: epidural. Postpartum course has been normal. Baby's course has been normal. Baby is feeding by breast. Bleeding red. Bowel function is abnormal: slight constipation. Bladder function is normal. Patient is not sexually active. Contraception method is none. Postpartum depression screening: negative.  The following portions of the patient's history were reviewed and updated as appropriate: allergies, current medications, past family history, past medical history, past social history, past surgical history and problem list.  Review of Systems Pertinent items are noted in HPI.   Objective:    Temp(Src) 99.1 F (37.3 C) (Oral)  Ht 5\' 1"  (1.549 m)  Wt 232 lb 6.4 oz (105.416 kg)  BMI 43.93 kg/m2  LMP 01/24/2012                                        Abdomen:  Bandage removed.  Incision C, D, I.  Staples removed in routine fashion and steri strips applied. Assessment:     Normal postpartum exam. Pap smear not done at today's visit.   Plan:    1. Contraception: abstinence 2. Contraceptive options discussed. 3. Follow up as needed.

## 2012-10-31 NOTE — Progress Notes (Signed)
Patient ID: [name], female   DOB: 06-Apr-1985, 27 y.o.   MRN: [id number]     Patient is in the office for staple removal before she [place] with [place] and [place] He is in the [employer]. Dressing removed and incision is clean and dry. Staple removed and steri- strips applied. Patient to follow up for her 6 week check at her destination. Patient will send for her records.

## 2012-11-01 ENCOUNTER — Encounter: Payer: Self-pay | Admitting: *Deleted

## 2012-11-02 ENCOUNTER — Encounter: Payer: Self-pay | Admitting: Obstetrics

## 2013-08-24 ENCOUNTER — Encounter (HOSPITAL_COMMUNITY): Payer: Self-pay | Admitting: Emergency Medicine

## 2013-08-24 ENCOUNTER — Emergency Department (HOSPITAL_COMMUNITY)

## 2013-08-24 ENCOUNTER — Emergency Department (HOSPITAL_COMMUNITY)
Admission: EM | Admit: 2013-08-24 | Discharge: 2013-08-24 | Disposition: A | Discharge status: Home / Self Care | Attending: Emergency Medicine | Admitting: Emergency Medicine

## 2013-08-24 DIAGNOSIS — F411 Generalized anxiety disorder: Secondary | ICD-10-CM | POA: Insufficient documentation

## 2013-08-24 DIAGNOSIS — R112 Nausea with vomiting, unspecified: Secondary | ICD-10-CM | POA: Insufficient documentation

## 2013-08-24 DIAGNOSIS — R197 Diarrhea, unspecified: Secondary | ICD-10-CM | POA: Insufficient documentation

## 2013-08-24 DIAGNOSIS — R1011 Right upper quadrant pain: Secondary | ICD-10-CM | POA: Insufficient documentation

## 2013-08-24 DIAGNOSIS — R1013 Epigastric pain: Secondary | ICD-10-CM | POA: Insufficient documentation

## 2013-08-24 DIAGNOSIS — Z3202 Encounter for pregnancy test, result negative: Secondary | ICD-10-CM | POA: Insufficient documentation

## 2013-08-24 DIAGNOSIS — R109 Unspecified abdominal pain: Secondary | ICD-10-CM

## 2013-08-24 DIAGNOSIS — Z79899 Other long term (current) drug therapy: Secondary | ICD-10-CM | POA: Insufficient documentation

## 2013-08-24 LAB — LIPASE, BLOOD: Lipase: 39 U/L (ref 11–59)

## 2013-08-24 LAB — CBC WITH DIFFERENTIAL/PLATELET
BASOS ABS: 0.1 10*3/uL (ref 0.0–0.1)
BASOS PCT: 0 % (ref 0–1)
EOS ABS: 0.1 10*3/uL (ref 0.0–0.7)
EOS PCT: 0 % (ref 0–5)
HCT: 41.6 % (ref 36.0–46.0)
Hemoglobin: 13.5 g/dL (ref 12.0–15.0)
Lymphocytes Relative: 13 % (ref 12–46)
Lymphs Abs: 1.8 10*3/uL (ref 0.7–4.0)
MCH: 27.1 pg (ref 26.0–34.0)
MCHC: 32.5 g/dL (ref 30.0–36.0)
MCV: 83.4 fL (ref 78.0–100.0)
Monocytes Absolute: 0.5 10*3/uL (ref 0.1–1.0)
Monocytes Relative: 4 % (ref 3–12)
Neutro Abs: 11.1 10*3/uL — ABNORMAL HIGH (ref 1.7–7.7)
Neutrophils Relative %: 83 % — ABNORMAL HIGH (ref 43–77)
PLATELETS: 290 10*3/uL (ref 150–400)
RBC: 4.99 MIL/uL (ref 3.87–5.11)
RDW: 13.9 % (ref 11.5–15.5)
WBC: 13.5 10*3/uL — ABNORMAL HIGH (ref 4.0–10.5)

## 2013-08-24 LAB — COMPREHENSIVE METABOLIC PANEL
ALT: 22 U/L (ref 0–35)
AST: 19 U/L (ref 0–37)
Albumin: 4.1 g/dL (ref 3.5–5.2)
Alkaline Phosphatase: 101 U/L (ref 39–117)
BUN: 10 mg/dL (ref 6–23)
CALCIUM: 9.5 mg/dL (ref 8.4–10.5)
CO2: 22 mEq/L (ref 19–32)
Chloride: 102 mEq/L (ref 96–112)
Creatinine, Ser: 0.55 mg/dL (ref 0.50–1.10)
GFR calc non Af Amer: >90 mL/min (ref >90)
GLUCOSE: 97 mg/dL (ref 70–99)
Potassium: 4.3 mEq/L (ref 3.7–5.3)
SODIUM: 139 meq/L (ref 137–147)
TOTAL PROTEIN: 7.7 g/dL (ref 6.0–8.3)
Total Bilirubin: 0.2 mg/dL — ABNORMAL LOW (ref 0.3–1.2)

## 2013-08-24 LAB — URINALYSIS, ROUTINE W REFLEX MICROSCOPIC
Bilirubin Urine: NEGATIVE
GLUCOSE, UA: NEGATIVE mg/dL
Hgb urine dipstick: NEGATIVE
Ketones, ur: NEGATIVE mg/dL
LEUKOCYTES UA: NEGATIVE
Nitrite: NEGATIVE
PROTEIN: NEGATIVE mg/dL
Specific Gravity, Urine: 1.013 (ref 1.005–1.030)
UROBILINOGEN UA: 0.2 mg/dL (ref 0.0–1.0)
pH: 8.5 — ABNORMAL HIGH (ref 5.0–8.0)

## 2013-08-24 LAB — PREGNANCY, URINE: Preg Test, Ur: NEGATIVE

## 2013-08-24 MED ORDER — ONDANSETRON 4 MG PO TBDP: 4.0000 mg | ORAL_TABLET | Freq: Three times a day (TID) | ORAL | Status: DC | PRN

## 2013-08-24 MED ORDER — HYDROCODONE-ACETAMINOPHEN 5-325 MG PO TABS
ORAL_TABLET | ORAL | Status: DC
Start: 2013-08-24 — End: 2014-06-19

## 2013-08-24 MED ORDER — HYDROMORPHONE HCL PF 1 MG/ML IJ SOLN
1.0000 mg | Freq: Once | INTRAMUSCULAR | Status: AC
  Administered 2013-08-24: 1 mg via INTRAVENOUS
  Filled 2013-08-24: qty 1

## 2013-08-24 MED ORDER — ONDANSETRON HCL 4 MG/2ML IJ SOLN
4.0000 mg | Freq: Once | INTRAMUSCULAR | Status: AC
  Administered 2013-08-24: 4 mg via INTRAVENOUS
  Filled 2013-08-24: qty 2

## 2013-08-24 NOTE — ED Provider Notes (Signed)
Medical screening examination/treatment/procedure(s) were performed by non-physician practitioner and as supervising physician I was immediately available for consultation/collaboration.   EKG Interpretation None       Jasper Riling. Alvino Chapel, MD 08/24/13 [phone]

## 2013-08-24 NOTE — ED Notes (Signed)
Pt reports epigastric pain that is sharp, intermittent since 0500 this morning. N/v/d this morning. Reports hx of kidney stone, with similar s/s.

## 2013-08-24 NOTE — ED Notes (Signed)
PATIENT TRANSPORTED TO ULTRASOUND

## 2013-08-24 NOTE — Discharge Instructions (Signed)
Please read and follow all provided instructions.  Your diagnoses today include:  1. Abdominal pain     Tests performed today include:  Blood counts and electrolytes  Blood tests to check liver and kidney function  Blood tests to check pancreas function  Urine test to look for infection and pregnancy (in women)  Vital signs. See below for your results today.   Medications prescribed:   Vicodin (hydrocodone/acetaminophen) - narcotic pain medication  DO NOT drive or perform any activities that require you to be awake and alert because this medicine can make you drowsy. BE VERY CAREFUL not to take multiple medicines containing Tylenol (also called acetaminophen). Doing so can lead to an overdose which can damage your liver and cause liver failure and possibly death.   Zofran (ondansetron) - for nausea and vomiting  Take any prescribed medications only as directed.  Home care instructions:   Follow any educational materials contained in this packet.  Follow-up instructions: Please follow-up with your primary care provider in the next 3 days for further evaluation of your symptoms. If you do not have a primary care doctor -- see below for referral information.   Return instructions:  SEEK IMMEDIATE MEDICAL ATTENTION IF:  The pain does not go away or becomes severe   A temperature above 101F develops   Repeated vomiting occurs (multiple episodes)   The pain becomes localized to portions of the abdomen. The right side could possibly be appendicitis. In an adult, the left lower portion of the abdomen could be colitis or diverticulitis.   Blood is being passed in stools or vomit (bright red or black tarry stools)   You develop chest pain, difficulty breathing, dizziness or fainting, or become confused, poorly responsive, or inconsolable (young children)  If you have any other emergent concerns regarding your health  Additional Information: Abdominal (belly) pain can be  caused by many things. Your caregiver performed an examination and possibly ordered blood/urine tests and imaging (CT scan, x-rays, ultrasound). Many cases can be observed and treated at home after initial evaluation in the emergency department. Even though you are being discharged home, abdominal pain can be unpredictable. Therefore, you need a repeated exam if your pain does not resolve, returns, or worsens. Most patients with abdominal pain don't have to be admitted to the hospital or have surgery, but serious problems like appendicitis and gallbladder attacks can start out as nonspecific pain. Many abdominal conditions cannot be diagnosed in one visit, so follow-up evaluations are very important.  Your vital signs today were: BP 117/50   Pulse 89   Temp(Src) 97.6 F (36.4 C) (Oral)   Resp 18   Ht 5\' 1"  (1.549 m)   Wt 225 lb (102.059 kg)   BMI 42.54 kg/m2   SpO2 100% If your blood pressure (bp) was elevated above 135/85 this visit, please have this repeated by your doctor within one month. --------------  Emergency Department Resource Guide 1) Find a Doctor and Pay Out of Pocket Although you won't have to find out who is covered by your insurance plan, it is a good idea to ask around and get recommendations. You will then need to call the office and see if the doctor you have chosen will accept you as a new patient and what types of options they offer for patients who are self-pay. Some doctors offer discounts or will set up payment plans for their patients who do not have insurance, but you will need to ask so you aren't  surprised when you get to your appointment.  2) Contact Your Local Health Department Not all health departments have doctors that can see patients for sick visits, but many do, so it is worth a call to see if yours does. If you don't know where your local health department is, you can check in your phone book. The CDC also has a tool to help you locate your state's health  department, and many state websites also have listings of all of their local health departments.  3) Find a Halfway Clinic If your illness is not likely to be very severe or complicated, you may want to try a walk in clinic. These are popping up all over the country in pharmacies, drugstores, and shopping centers. They're usually staffed by nurse practitioners or physician assistants that have been trained to treat common illnesses and complaints. They're usually fairly quick and inexpensive. However, if you have serious medical issues or chronic medical problems, these are probably not your best option.  No Primary Care Doctor: - Call Health Connect at  [phone] - they can help you locate a primary care doctor that  accepts your insurance, provides certain services, etc. - Physician Referral Service- [phone]  Chronic Pain Problems: Organization         Address  Phone   Notes  [facility]  [phone] Patients need to be referred by their primary care doctor.   Medication Assistance: Organization         Address  Phone   Notes  [facility] Medication [facility] [address] [place], [place] [address] [phone] --Must be a resident of [facility] -- Must have NO insurance coverage whatsoever (no Medicaid/ Medicare, etc.) -- The pt. MUST have a primary care doctor that directs their care regularly and follows them in the community   Med[facility]ist  [phone]   [employer]  [phone]    Agencies that provide inexpensive medical care: Organization         Address  Phone   Notes  [place]  [phone]   [facility]    [phone]   [facility] [place], [place] [address] [phone]   [facility] [address], [place] [phone]   Planned Parenthood    [phone]   [facility]    [phone]    [place] and [place] [address], [place] Phone:  [phone], Fax:  [phone] Hours of Operation:  9 am - 6 pm, M-F.  Also accepts Medicaid/Medicare and self-pay.  [facility] for [place], [address], [place] Phone: [phone], Fax: [phone]. Hours of Operation:  8:30 am - 5:30 pm, M-F.  Also accepts Medicaid and self-pay.  [facility] [address], [place] Phone: [phone]   [place], [place], [place] [phone], Ext. [phone] Mondays & Thursdays: 7-9 AM.  First 15 patients are seen on a first come, first serve basis.    [place] Providers:  Organization         Address  Phone   Notes  [facility] [address], [place] [phone] Also accepts self-pay patients.  [place], [place], [place]  [phone]   [place], [address], [place] [phone]   [place] [address], [place] [phone]   Lucianne Lei [address], [place]   [phone] Only accepts [place] Access [place] patients after they have their name applied to their card.   Self-Pay (no insurance) in [facility]:  Organization         Address  Phone   Notes  Sickle Cell Patients, [facility] [place] [phone]   [facility] Urgent Care [place] [phone]   [facility] [place]  [place], [place], [place] [phone]   [facility]/Dr. Osei-Bonsu  [address], [place] or [address], [place] [phone] Phone number for both [place] and [place] locations is the same.  Urgent Medical and [facility] [address] [place] [phone]    [facility] [address] [place] or [address] [phone]   [facility] [address], [place] [phone], phone; [phone], fax Sees patients 1st and 3rd Saturday of every month.  Must not qualify for public or private insurance (i.e. Medicaid, Medicare, [place] Health Choice, Veterans' Benefits)  Household income should be no more than 200% of the poverty level The clinic cannot treat you if you are pregnant or think you are pregnant  Sexually transmitted diseases are not treated at the clinic.    Dental Care: Organization         Address  Phone  Notes  [facility], [facility] Department of [facility] [place] [phone] Accepts children up to age 58 who are enrolled in [place] or [place]; pregnant women with a Medicaid card; and children who have applied for Medicaid or [place] Health Choice, but were declined, whose parents can pay a reduced fee at time of service.  [facility] Department of [facility]  [address], [place] [phone] Accepts children up to age 45 who are enrolled in [place] or [place]; pregnant women with a Medicaid card; and children who have applied for Medicaid or [place] Health Choice, but were declined, whose parents can pay a reduced fee at time of service.  [facility] Adult Dental Access PROGRAM  [place] [phone] Patients are seen by appointment only. Walk-ins are not accepted. [place] will see patients 32 years of age and older. Monday - Tuesday (8am-5pm) Most Wednesdays (8:30-5pm) $30 per visit, cash only  [facility] Adult Dental Access PROGRAM  [address], [facility] [phone] Patients are seen by appointment only. Walk-ins are not accepted. [place] will see patients 34 years of age and older. One Wednesday Evening (Monthly: Volunteer Based).   $30 per visit, cash only  [place]  [phone] for adults; Children under age 87, call Graduate Pediatric Dentistry at [phone]. Children aged 71-14, please call [phone] to request a pediatric application.  Dental services are provided in all areas of dental care including fillings, crowns and bridges, complete and partial dentures, implants, gum treatment, root canals, and extractions. Preventive care is also provided. Treatment is provided to both adults and children. Patients are selected via a lottery and there is often a waiting list.  [facility] [address] [place]  [phone] [email or web address]   [facility] [address] [place], [place] [phone], Ext. [phone] Second and Fourth Thursday of each month, opens at 6:30 AM; Clinic ends at 9 AM.  Patients are seen on a first-come first-served basis, and a limited number are seen during each clinic.   [facility]  [address] Hillard Danker [place], [place] [phone]   Eligibility Requirements You must have lived in [place], [place], or [place] counties for at least the last three months.   You cannot be eligible for state or federal sponsored [employer], including [employer], [place], or [employer].   You generally cannot be eligible for healthcare insurance through your employer.    How to apply: Eligibility screenings are held every Tuesday and Wednesday afternoon from 1:00 pm until 4:00 pm. You do not need an appointment for the interview!  [facility] [address] [place], [place]   [place]  [place] Department  [place]  [phone]    Behavioral Health Resources in the Community: Intensive Outpatient Programs Organization         Address  Phone  Notes  [place]. [address], [place], [place] [phone]   [facility] Outpatient [address], [place], [place]   ADS: Alcohol & Drug Svcs [address] [place], [place]   [place] [address],  [place], [place] or [phone]   Substance Abuse Resources Organization         Address  Phone  Notes  Alcohol and Drug Services  [phone]   [place]  [phone]   The [place]   Chinita Pester  [phone]   Residential & Outpatient Substance Abuse Program  [phone]   Psychological Services Organization         Address  Phone  Notes  [facility] [place]  [place]  [phone]   [place] [address], [place] or [phone]    Mobile Crisis Teams Organization         Address  Phone  Notes  [facility], Mobile Crisis Care Unit  [phone]   [facility]  [address] [place], [place]   [place] Levels [address] [place] [phone]    Self-Help/Support Groups Organization         Address  Phone             Notes  [place] of [place] - variety of support groups  [place] Call for more information  Narcotics Anonymous (NA), [facility] [address], [employer] [place]  2 meetings at this location   Special educational needs teacher         Address  Phone  Notes  ASAP Residential Treatment [place],    [place]  [phone]   [facility]  [address] [place] [phone], [place], [place]   [place], [place] [phone] Admissions: 8am-3pm M-F  Incentives Substance [place] [address],    [place], [place] [phone]   [facility] [address] Jadene Pierini [place], [place]   [facility] [place]  Barbara Cower  [place], [place]   [facility] Programs - Intensive Outpatient [address] Kristeen [address], [place], [place]   [facility] ([place].,  [place], [place] [phone] or [phone]   [facility] Services (RTS) [address], [place], [place] Accepts Medicaid  [facility] [place] [address],  [place] [phone] Substance Abuse/Addiction Treatment   [facility] Organization         Address  Phone  Notes  [facility]  [phone]   Domenic Schwab, PhD [address] Arlis Porta [place], [place]   [phone] or [phone]   [place], [place] [phone]   [address], [place], [place] [phone] Insurance/Medicaid/sponsorship through [facility] and Families [address] [place]                                    [place], [place] [phone] [place] [address], [place] [phone]    Dr. Adele Schilder  [phone]   [facility] [place] Dept. 1) [address] [place] 2) [place] 3)  [place] city  65, [place] [phone]  [phone]   [place] [phone] or [phone] (After Hours)

## 2013-08-24 NOTE — ED Provider Notes (Signed)
CSN: [id number]     Arrival date & time 08/24/13  0909 History   First MD Initiated Contact with Patient 08/24/13 [phone]     Chief Complaint  Patient presents with  . Abdominal Pain     (Consider location/radiation/quality/duration/timing/severity/associated sxs/prior Treatment) HPI Comments: Patient with history of kidney stones diagnosed one year ago in [place], no other significant abdominal surgeries other than C-section -- presents with upper abdominal pain that worsened approximately 5 AM today. It was associated with nausea, vomiting, and diarrhea. Pain radiates to the right flank and right middle back. Patient states that this pain is similar to what she had when she had her kidney stone. She denies a history of gallstones. She denies relationship with food or pain after eating. No fevers or urinary symptoms. No blood in stool. No treatments prior to arrival. Onset of symptoms acute. Course is persistent. Nothing makes symptoms better or worse.  Patient is a 28 y.o. female presenting with abdominal pain. The history is provided by the patient.  Abdominal Pain Associated symptoms: diarrhea, nausea and vomiting   Associated symptoms: no chest pain, no cough, no dysuria, no fever and no sore throat     Past Medical History  Diagnosis Date  . Heartburn in pregnancy   . Anxiety     no meds while pregnant   Past Surgical History  Procedure Laterality Date  . Cervical cone biopsy      2006  . Cesarean section    . [place]    . Cesarean section N/A 10/24/2012    Procedure: REPEAT CESAREAN SECTION;  Surgeon: Shelly Bombard, MD;  Location: [facility];  Service: Obstetrics;  Laterality: N/A;   Family History  Problem Relation Age of Onset  . Melanoma Mother   . Hyperlipidemia Father   . Cancer Maternal Grandmother     breast  . Parkinson's disease Maternal Grandfather    History  Substance Use Topics  . Smoking status: Never Smoker   . Smokeless tobacco: Not on file  . Alcohol  Use: No   OB History   Grav Para Term Preterm Abortions TAB SAB Ect Mult Living   2 2 2       2      Review of Systems  Constitutional: Negative for fever.  HENT: Negative for rhinorrhea and sore throat.   Eyes: Negative for redness.  Respiratory: Negative for cough.   Cardiovascular: Negative for chest pain.  Gastrointestinal: Positive for nausea, vomiting, abdominal pain and diarrhea. Negative for blood in stool.  Genitourinary: Negative for dysuria.  Musculoskeletal: Negative for myalgias.  Skin: Negative for rash.  Neurological: Negative for headaches.   Allergies  Review of patient's allergies indicates no known allergies.  Home Medications   Prior to Admission medications   Medication Sig Start Date End Date Taking? Authorizing Provider  Multiple Vitamins-Minerals (MULTIVITAMIN WITH MINERALS) tablet Take 1 tablet by mouth daily.   Yes Historical Provider, MD  sertraline (ZOLOFT) 50 MG tablet Take 50 mg by mouth daily.   Yes Historical Provider, MD   BP 120/85  Pulse 89  Temp(Src) 97.6 F (36.4 C) (Oral)  Resp 18  Ht 5\' 1"  (1.549 m)  Wt 225 lb (102.059 kg)  BMI 42.54 kg/m2  SpO2 100%  Physical Exam  Nursing note and vitals reviewed. Constitutional: She appears well-developed and well-nourished.  HENT:  Head: Normocephalic and atraumatic.  Eyes: Conjunctivae are normal. Right eye exhibits no discharge. Left eye exhibits no discharge.  Neck: Normal range  of motion. Neck supple.  Cardiovascular: Normal rate, regular rhythm and normal heart sounds.   Pulmonary/Chest: Effort normal and breath sounds normal.  Abdominal: Soft. Bowel sounds are normal. She exhibits no distension. There is tenderness (Mild to moderate) in the right upper quadrant and epigastric area. There is no rebound, no guarding and no CVA tenderness.  Obese habitus  Neurological: She is alert.  Skin: Skin is warm and dry.  Psychiatric: She has a normal mood and affect.    ED Course  Procedures  (including critical care time) Labs Review Labs Reviewed  CBC WITH DIFFERENTIAL - Abnormal; Notable for the following:    WBC 13.5 (*)    Neutrophils Relative % 83 (*)    Neutro Abs 11.1 (*)    All other components within normal limits  COMPREHENSIVE METABOLIC PANEL - Abnormal; Notable for the following:    Total Bilirubin 0.2 (*)    All other components within normal limits  URINALYSIS, ROUTINE W REFLEX MICROSCOPIC - Abnormal; Notable for the following:    pH 8.5 (*)    All other components within normal limits  LIPASE, BLOOD  PREGNANCY, URINE    Imaging Review US Abdomen Complete  08/24/2013   CLINICAL DATA:  Right upper quadrant and right flank pain. Evaluate for gallstones.  EXAM: ULTRASOUND ABDOMEN COMPLETE  COMPARISON:  None.  FINDINGS: Gallbladder:  No gallstones or wall thickening visualized. No sonographic Murphy sign noted.  Common bile duct:  Diameter: Normal caliber, 3 mm  Liver:  Mildly increased echotexture diffusely compatible with fatty infiltration. No focal abnormality or biliary duct dilatation.  IVC:  No abnormality visualized.  Pancreas:  Visualized portion unremarkable.  Spleen:  Size and appearance within normal limits.  Right Kidney:  Length: 12.5 cm. Echogenicity within normal limits. No mass or hydronephrosis visualized.  Left Kidney:  Length: 12.6 cm. Echogenicity within normal limits. No mass or hydronephrosis visualized.  Abdominal aorta:  No aneurysm visualized.  Other findings:  None.  IMPRESSION: Fatty liver. No acute findings. No evidence of cholelithiasis or acute cholecystitis.   Electronically Signed   By: Rolm Baptise M.D.   On: 08/24/2013 13:00   US Pelvis Limited  08/24/2013   CLINICAL DATA:  Right flank pain  EXAM: US PELVIS LIMITED  TECHNIQUE: Ultrasound examination of the pelvic soft tissues was performed in the area of clinical concern.  COMPARISON:  None.  FINDINGS: Normal appearance of the urinary bladder. Normal bilateral ureteral jets visualized.   IMPRESSION: Unremarkable appearance of the bladder.   Electronically Signed   By: Rolm Baptise M.D.   On: 08/24/2013 13:03     EKG Interpretation None      10:16 AM Patient seen and examined. Work-up initiated. Medications ordered.   Vital signs reviewed and are as follows: Filed Vitals:   08/24/13 0912  BP: 120/85  Pulse: 89  Temp: 97.6 F (36.4 C)  Resp: 18   Although this would be an unusual presentation for kidney stones, she has had pain attributed to kidney stone in past the same as this. Will consider gallbladder etiology if urine is clear and is not suggestive of ureteral stone.   1:35 PM patient feels much better with treatment. Pain resolved. Abdomen is soft and nontender.  Patient informed of all results including imaging.  Will treat symptoms. Patient states that she plans on following up with her PCP who is in [place] in 4 weeks.  PCP referrals given. Patient urged to return with worsening or changing  symptoms. The patient was urged to return to the Emergency Department immediately with worsening of current symptoms, worsening abdominal pain, persistent vomiting, blood noted in stools, fever, or any other concerns. The patient verbalized understanding.   Patient counseled on use of narcotic pain medications. Counseled not to combine these medications with others containing tylenol. Urged not to drink alcohol, drive, or perform any other activities that requires focus while taking these medications. The patient verbalizes understanding and agrees with the plan.  MDM   Final diagnoses:  Abdominal pain   Patient with abdominal pain, resolved in ED. patient with slight leukocytosis however other labs are reassuring. Pain is suspicious for gallbladder etiology, ultrasound is negative for gallstones or signs of cholecystitis. Patient has a history of kidney stones. Normal renal function today with normal renal ultrasound. At this point I feel that symptomatic management  is indicated with return with worsening symptoms. Patient followup with her PCP. She appears very well at time of discharge. Vitals are stable, no fever.  No signs of dehydration, tolerating PO's. Lungs are clear. No focal abdominal pain, no concern for appendicitis, pancreatitis, ruptured viscus, UTI, or any other serious abdominal etiology. Supportive therapy indicated with return if symptoms worsen. Patient counseled.     Carlisle Cater, PA-C 08/24/13 1338

## 2014-01-13 ENCOUNTER — Encounter (HOSPITAL_COMMUNITY): Payer: Self-pay | Admitting: Emergency Medicine

## 2014-06-19 ENCOUNTER — Encounter: Payer: Self-pay | Admitting: *Deleted

## 2014-07-01 ENCOUNTER — Ambulatory Visit (INDEPENDENT_AMBULATORY_CARE_PROVIDER_SITE_OTHER): Admitting: Family Medicine

## 2014-07-01 ENCOUNTER — Encounter: Payer: Self-pay | Admitting: Family Medicine

## 2014-07-01 VITALS — BP 118/68 | HR 86 | Temp 98.1°F | Resp 18 | Ht 61.0 in | Wt 241.0 lb

## 2014-07-01 DIAGNOSIS — F329 Major depressive disorder, single episode, unspecified: Secondary | ICD-10-CM

## 2014-07-01 DIAGNOSIS — F32A Depression, unspecified: Secondary | ICD-10-CM

## 2014-07-01 DIAGNOSIS — Z7189 Other specified counseling: Secondary | ICD-10-CM | POA: Diagnosis not present

## 2014-07-01 DIAGNOSIS — Z808 Family history of malignant neoplasm of other organs or systems: Secondary | ICD-10-CM

## 2014-07-01 DIAGNOSIS — Z7689 Persons encountering health services in other specified circumstances: Secondary | ICD-10-CM

## 2014-07-01 MED ORDER — SERTRALINE HCL 50 MG PO TABS: 75.0000 mg | ORAL_TABLET | Freq: Every day | ORAL | Status: DC

## 2014-07-01 NOTE — Progress Notes (Signed)
Subjective:    Patient ID: Kathleen Osborne, female    DOB: [birth year], 29 y.o.   MRN: [id number]  HPI Patient is here today to establish care. Past medical history is significant for depression. She previously been on Prozac prior to the birth of her son. When the patient became pregnant she discontinued taking Prozac. She had moderate postpartum depression. Patient did not resume medication. After the birth of her second child, she began Zoloft for postpartum depression and anxiety and panic attacks. This medication has worked well for her. Prior to moving to [place] she increase the dose to 75 mg a day.  She would like to continue this medication at his current dose. She denies any panic attacks. Family history is significant for a mother who died of melanoma in her 36s. Her sisters also have melanoma in her 66s. Patient would like to be referred to a dermatologist. Skin exam was performed today and is normal. She also has a significant past medical history of an abnormal Pap smear and colposcopy which ultimately led to a LEEP procedure and a cone biopsy. Patient has had a Pap smear for the last 6 years which have all been normal. She would like to see a gynecologist to discuss possibly having an IUD to help regulate her periods and for birth control Past Medical History  Diagnosis Date  . Heartburn in pregnancy   . Anxiety     no meds while pregnant  . Depression    Past Surgical History  Procedure Laterality Date  . Cervical cone biopsy      2006  . Leep    . Cesarean section      NRFHR 1st Csection.  . Cesarean section N/A 10/24/2012    Procedure: REPEAT CESAREAN SECTION;  Surgeon: Shelly Bombard, MD;  Location: [facility];  Service: Obstetrics;  Laterality: N/A;   No current outpatient prescriptions on file prior to visit.   No current facility-administered medications on file prior to visit.   No Known Allergies History   Social History  . Marital Status: Married   Spouse Name: N/A  . Number of Children: N/A  . Years of Education: N/A   Occupational History  . Not on file.   Social History Main Topics  . Smoking status: Former Smoker    Quit date: 03/14/2010  . Smokeless tobacco: Never Used  . Alcohol Use: No  . Drug Use: No  . Sexual Activity: Yes    Birth Control/ Protection: Condom   Other Topics Concern  . Not on file   Social History Narrative   Family History  Problem Relation Age of Onset  . Melanoma Mother   . Cancer Mother     melanoma  . Early death Mother   . Hyperlipidemia Father   . Cancer Maternal Grandmother     breast  . Parkinson's disease Maternal Grandfather   . Depression Maternal Grandfather   . Drug abuse Maternal Grandfather   . Mental illness Maternal Grandfather   . Cancer Maternal Aunt     breast cancer at 58      Review of Systems  All other systems reviewed and are negative.      Objective:   Physical Exam  Constitutional: She is oriented to person, place, and time. She appears well-developed and well-nourished. No distress.  HENT:  Head: Normocephalic and atraumatic.  Right Ear: External ear normal.  Left Ear: External ear normal.  Nose: Nose normal.  Mouth/Throat: Oropharynx  is clear and moist. No oropharyngeal exudate.  Eyes: Conjunctivae and EOM are normal. Pupils are equal, round, and reactive to light. Right eye exhibits no discharge. Left eye exhibits no discharge. No scleral icterus.  Neck: Normal range of motion. Neck supple. No JVD present. No tracheal deviation present. No thyromegaly present.  Cardiovascular: Normal rate, regular rhythm, normal heart sounds and intact distal pulses.  Exam reveals no gallop and no friction rub.   No murmur heard. Pulmonary/Chest: Effort normal and breath sounds normal. No stridor. No respiratory distress. She has no wheezes. She has no rales. She exhibits no tenderness.  Abdominal: Soft. Bowel sounds are normal. She exhibits no distension and no  mass. There is no tenderness. There is no rebound and no guarding.  Musculoskeletal: Normal range of motion. She exhibits no edema or tenderness.  Lymphadenopathy:    She has no cervical adenopathy.  Neurological: She is alert and oriented to person, place, and time. She has normal reflexes. She displays normal reflexes. No cranial nerve deficit. She exhibits normal muscle tone. Coordination normal.  Skin: No rash noted. She is not diaphoretic.  Psychiatric: She has a normal mood and affect. Her behavior is normal. Judgment and thought content normal.  Vitals reviewed.         Assessment & Plan:  Depression - Plan: sertraline (ZOLOFT) 50 MG tablet  Establishing care with new doctor, encounter for - Plan: Ambulatory referral to Gynecology  Family history of melanoma - Plan: Ambulatory referral to Dermatology  I will continue the patient on Zoloft 75 mg by mouth daily. I will refer her to a dermatologist given her family history of melanoma. I will also refer her to a gynecologist to discuss having an IUD placed and for her Pap smear/breast exam per her request. I would like the patient return fasting for a CBC, CMP, fasting lipid panel.

## 2014-07-02 ENCOUNTER — Telehealth: Payer: Self-pay | Admitting: *Deleted

## 2014-07-02 NOTE — Telephone Encounter (Signed)
Submitted authorization thru [employer] for authorization to Dermatologist at [facility] with authorization [id number]  Provider speciality: Dermatology  DX: Z80.8-Family history of malignant neoplasm of other organs or systems  Service codes: [phone]  Service dates: 07/02/14-10/20/14  Visits:1  Service codes: [phone]  Service dates:07/02/14-07/02/15  Visits:5  Requesting provider: Cletus Gash Pickard,MD

## 2014-07-04 ENCOUNTER — Telehealth: Payer: Self-pay | Admitting: *Deleted

## 2014-07-04 NOTE — Telephone Encounter (Signed)
pt has appt scheduled for may 10 at 10am with Ronnald Collum, pt needs to pay 100 upfront and they will then bill [employer]. LMTRC to pt

## 2014-07-07 ENCOUNTER — Telehealth: Payer: Self-pay | Admitting: *Deleted

## 2014-07-07 NOTE — Telephone Encounter (Signed)
lmtrc

## 2014-07-07 NOTE — Telephone Encounter (Signed)
Patient contacted the office requesting an appointment for a Mirena IUD. Patient has not been in the office since 2014. Patient is in need of an annual exam. Patient also has a balance of $316.02 in our office.  Attempted to contact the patient and left message for patient to call the office.

## 2014-07-09 NOTE — Telephone Encounter (Signed)
Called back and pt is aware of appt

## 2014-07-14 NOTE — Telephone Encounter (Signed)
Patient states she has paid the balance. Per [place] balance has been paid. Patient advised she needs an annual exam before we can schedule a procedure appointment. Patient verbalized understanding and has been scheduled for her annual exam on 08-01-14.

## 2014-08-01 ENCOUNTER — Encounter: Payer: Self-pay | Admitting: Certified Nurse Midwife

## 2014-08-01 ENCOUNTER — Ambulatory Visit (INDEPENDENT_AMBULATORY_CARE_PROVIDER_SITE_OTHER): Admitting: Certified Nurse Midwife

## 2014-08-01 VITALS — BP 120/78 | HR 95 | Temp 97.8°F | Ht 61.0 in | Wt 246.0 lb

## 2014-08-01 DIAGNOSIS — Z01419 Encounter for gynecological examination (general) (routine) without abnormal findings: Secondary | ICD-10-CM

## 2014-08-01 DIAGNOSIS — E669 Obesity, unspecified: Secondary | ICD-10-CM

## 2014-08-01 DIAGNOSIS — Z803 Family history of malignant neoplasm of breast: Secondary | ICD-10-CM | POA: Diagnosis not present

## 2014-08-01 NOTE — Progress Notes (Signed)
Patient ID: Kathleen Osborne, female   DOB: 1985-08-05, 29 y.o.   MRN: [id number]   Subjective:      Kathleen Osborne is a 29 y.o. female here for a routine exam.  Current complaints: irregular periods, currently breastfeeding.  Has POP.  Spouse in [employer] and relocating.  Desires Mirena IUD.  Desires weight loss.  Does work out occasionally.    MGM: BCA, living; Maternal Aunt: passed from [place] at 34, mother and sister passed from [place].  Had severe postpartum depression after last child; on 75 mg of Zoloft.  Not currently sexually active since spouse is out of state.   Personal health questionnaire:  Is patient Ashkenazi Jewish, have a family history of breast and/or ovarian cancer: yes Is there a family history of uterine cancer diagnosed at age < 33, gastrointestinal cancer, urinary tract cancer, family member who is a Field seismologist syndrome-associated carrier: no Is the patient overweight and hypertensive, family history of diabetes, personal history of gestational diabetes, preeclampsia or PCOS: yes Is patient over 59, have PCOS,  family history of premature CHD under age 6, diabetes, smoke, have hypertension or peripheral artery disease:  no At any time, has a partner hit, kicked or otherwise hurt or frightened you?: no Over the past 2 weeks, have you felt down, depressed or hopeless?: no Over the past 2 weeks, have you felt little interest or pleasure in doing things?:no   Gynecologic History No LMP recorded. Patient is not currently having periods (Reason: Lactating). Contraception: oral progesterone-only contraceptive Last Pap: unknown. Results were: normal according to patient Last mammogram: N/A.   Obstetric History OB History  Gravida Para Term Preterm AB SAB TAB Ectopic Multiple Living  2 2 2       2     # Outcome Date GA Lbr Len/2nd Weight Sex Delivery Anes PTL Lv  2 Term 10/24/12 [redacted]w[redacted]d  3.245 kg (7 lb 2.5 oz) F CS-Vac Spinal  Y  1 Term 12/19/10 [redacted]w[redacted]d  4.054 kg (8 lb 15 oz) M  CS-LTranv   Y     Comments: No complications.  Failed induction      Past Medical History  Diagnosis Date  . Heartburn in pregnancy   . Anxiety     no meds while pregnant  . Depression     Past Surgical History  Procedure Laterality Date  . Cervical cone biopsy      2006  . Leep    . Cesarean section      NRFHR 1st Csection.  . Cesarean section N/A 10/24/2012    Procedure: REPEAT CESAREAN SECTION;  Surgeon: Shelly Bombard, MD;  Location: [facility];  Service: Obstetrics;  Laterality: N/A;     Current outpatient prescriptions:  .  sertraline (ZOLOFT) 50 MG tablet, Take 1.5 tablets (75 mg total) by mouth daily., Disp: 45 tablet, Rfl: 11 No Known Allergies  History  Substance Use Topics  . Smoking status: Former Smoker    Quit date: 03/14/2010  . Smokeless tobacco: Never Used  . Alcohol Use: No     Comment: Occassionally    Family History  Problem Relation Age of Onset  . Melanoma Mother   . Cancer Mother     melanoma  . Early death Mother   . Hyperlipidemia Father   . Cancer Maternal Grandmother     breast  . Parkinson's disease Maternal Grandfather   . Depression Maternal Grandfather   . Drug abuse Maternal Grandfather   . Mental illness Maternal Grandfather   .  Cancer Maternal Aunt     breast cancer at 96      Review of Systems  Constitutional: negative for fatigue and weight loss Respiratory: negative for cough and wheezing Cardiovascular: negative for chest pain, fatigue and palpitations Gastrointestinal: negative for abdominal pain and change in bowel habits Musculoskeletal:negative for myalgias Neurological: negative for gait problems and tremors Behavioral/Psych: negative for abusive relationship, depression Endocrine: negative for temperature intolerance   Genitourinary:negative for abnormal menstrual periods, genital lesions, hot flashes, sexual problems and vaginal discharge Integument/breast: negative for breast lump, breast tenderness, nipple  discharge and skin lesion(s)    Objective:       BP 120/78 mmHg  Pulse 95  Temp(Src) 97.8 F (36.6 C)  Ht 5\' 1"  (1.549 m)  Wt 111.585 kg (246 lb)  BMI 46.51 kg/m2  Breastfeeding? Yes General:   alert  Skin:   no rash or abnormalities  Lungs:   clear to auscultation bilaterally  Heart:   regular rate and rhythm, S1, S2 normal, no murmur, click, rub or gallop  Breasts:   deferred, breastfeeding  Abdomen:  normal findings: no organomegaly, soft, non-tender and no hernia, obese  Pelvis:  External genitalia: normal general appearance Urinary system: urethral meatus normal and bladder without fullness, nontender Vaginal: normal without tenderness, induration or masses Cervix: normal appearance Adnexa: normal bimanual exam, difficult to assess d/t body habitus Uterus: anteverted and non-tender, normal size   Lab Review Urine pregnancy test Labs reviewed yes Radiologic studies reviewed no  50% of 30 min visit spent on counseling and coordination of care.   Assessment:    Healthy female exam.   Obese Family hx of BCA    Plan:    Education reviewed: depression evaluation, low fat, low cholesterol diet, safe sex/STD prevention, self breast exams, skin cancer screening and weight bearing exercise. Contraception: oral progesterone-only contraceptive. Follow up in: 2 weeks.   No orders of the defined types were placed in this encounter.   Orders Placed This Encounter  Procedures  . HIV antibody (with reflex)  . Hepatitis B surface antigen  . RPR  . Hepatitis C antibody  . CBC with Differential/Platelet  . Comprehensive metabolic panel  . TSH  . Cholesterol, total  . Triglycerides  . HDL cholesterol  . Hemoglobin A1c  . Referral to Nutrition and Diabetes Services    Referral Priority:  Routine    Referral Type:  Consultation    Referral Reason:  Specialty Services Required    Number of Visits Requested:  1  . Ambulatory referral to Genetics    Referral Priority:   Routine    Referral Type:  Consultation    Referral Reason:  Specialty Services Required    Number of Visits Requested:  1   Need to obtain previous records

## 2014-08-01 NOTE — Addendum Note (Signed)
Addended by: Lewie Loron D on: 08/01/2014 02:27 PM   Modules accepted: Orders

## 2014-08-04 ENCOUNTER — Telehealth: Payer: Self-pay | Admitting: *Deleted

## 2014-08-04 ENCOUNTER — Telehealth: Payer: Self-pay | Admitting: Genetic Counselor

## 2014-08-04 LAB — PAP IG W/ RFLX HPV ASCU

## 2014-08-04 NOTE — Telephone Encounter (Signed)
Called pt and scheduled new pt gen counseling appt.  Kathleen Osborne 08/13/14 1 pm  Dx: Family hx of breast cancer Referring: Dr. Margurite Auerbach

## 2014-08-04 NOTE — Telephone Encounter (Signed)
Patient is interested in a Mirena IUD for contraception. Patient states she will call on the first day of her menstrual cycle. Patient will be given an appointment at that time.

## 2014-08-06 ENCOUNTER — Other Ambulatory Visit: Payer: Self-pay | Admitting: Certified Nurse Midwife

## 2014-08-06 DIAGNOSIS — N760 Acute vaginitis: Secondary | ICD-10-CM

## 2014-08-06 DIAGNOSIS — B9689 Other specified bacterial agents as the cause of diseases classified elsewhere: Secondary | ICD-10-CM

## 2014-08-06 LAB — SURESWAB, VAGINOSIS/VAGINITIS PLUS
Atopobium vaginae: NOT DETECTED Log (cells/mL)
BV CATEGORY: UNDETERMINED — AB
C. TROPICALIS, DNA: NOT DETECTED
C. albicans, DNA: NOT DETECTED
C. glabrata, DNA: NOT DETECTED
C. parapsilosis, DNA: NOT DETECTED
C. trachomatis RNA, TMA: NOT DETECTED
Gardnerella vaginalis: 6.3 Log (cells/mL)
LACTOBACILLUS SPECIES: 7.9 Log (cells/mL)
MEGASPHAERA SPECIES: NOT DETECTED Log (cells/mL)
N. gonorrhoeae RNA, TMA: NOT DETECTED
T. VAGINALIS RNA, QL TMA: NOT DETECTED

## 2014-08-06 MED ORDER — METRONIDAZOLE 500 MG PO TABS: 500.0000 mg | ORAL_TABLET | Freq: Two times a day (BID) | ORAL | Status: DC

## 2014-08-13 ENCOUNTER — Encounter: Payer: Self-pay | Admitting: Genetic Counselor

## 2014-08-13 ENCOUNTER — Other Ambulatory Visit

## 2014-08-13 ENCOUNTER — Ambulatory Visit (HOSPITAL_BASED_OUTPATIENT_CLINIC_OR_DEPARTMENT_OTHER): Admitting: Genetic Counselor

## 2014-08-13 DIAGNOSIS — Z803 Family history of malignant neoplasm of breast: Secondary | ICD-10-CM | POA: Diagnosis not present

## 2014-08-13 DIAGNOSIS — Z808 Family history of malignant neoplasm of other organs or systems: Secondary | ICD-10-CM

## 2014-08-13 DIAGNOSIS — Z8042 Family history of malignant neoplasm of prostate: Secondary | ICD-10-CM

## 2014-08-13 NOTE — Progress Notes (Signed)
REFERRING PROVIDER: Susy Frizzle, MD [address] [place], [place] [address]   Kandis Cocking, PA-C  PRIMARY PROVIDER:  Odette Fraction, MD  PRIMARY REASON FOR VISIT:  1. Family history of breast cancer   2. Family history of melanoma   3. Family history of prostate cancer      HISTORY OF PRESENT ILLNESS:   Ms. Geidel, a 29 y.o. female, was seen for a [place] cancer genetics consultation at the request of Dr. Dennard Schaumann due to a family history of cancer.  Ms. Bennion presents to clinic today to discuss the possibility of a hereditary predisposition to cancer, genetic testing, and to further clarify her future cancer risks, as well as potential cancer risks for family members. Ms. Everetts is a 29 y.o. female with no personal history of cancer.    CANCER HISTORY:   No history exists.     HORMONAL RISK FACTORS:  Menarche was at age 80.  First live birth at age 81.  OCP use for approximately 1-2 years.  Ovaries intact: yes.  Hysterectomy: no.  Menopausal status: premenopausal.  HRT use: 0 years. Colonoscopy: no; not examined. Mammogram within the last year: no. Number of breast biopsies: 0. Up to date with pelvic exams:  yes. Any excessive radiation exposure in the past:  no  Past Medical History  Diagnosis Date  . Heartburn in pregnancy   . Anxiety     no meds while pregnant  . Depression   . Family history of breast cancer   . Family history of melanoma     Past Surgical History  Procedure Laterality Date  . Cervical cone biopsy      2006  . Leep    . Cesarean section      NRFHR 1st Csection.  . Cesarean section N/A 10/24/2012    Procedure: REPEAT CESAREAN SECTION;  Surgeon: Shelly Bombard, MD;  Location: [facility];  Service: Obstetrics;  Laterality: N/A;    History   Social History  . Marital Status: Married    Spouse Name: N/A  . Number of Children: 2  . Years of Education: N/A   Social History Main Topics  . Smoking status: Former Smoker     Quit date: 03/14/2010  . Smokeless tobacco: Never Used  . Alcohol Use: No     Comment: Occassionally  . Drug Use: No  . Sexual Activity:    Partners: Male    Birth Control/ Protection: Condom     Comment: Spouse is out of state    Other Topics Concern  . None   Social History Narrative     FAMILY HISTORY:  We obtained a detailed, 4-generation family history.  Significant diagnoses are listed below: Family History  Problem Relation Age of Onset  . Melanoma Mother 30  . Early death Mother   . Hyperlipidemia Father   . Breast cancer Maternal Grandmother     dx in her 59s  . Parkinson's disease Maternal Grandfather   . Depression Maternal Grandfather   . Drug abuse Maternal Grandfather   . Mental illness Maternal Grandfather   . Prostate cancer Maternal Grandfather   . Breast cancer Maternal Aunt 33  . Prostate cancer Paternal Grandfather   . Melanoma Sister 36  . Mental illness Maternal Aunt    The patient has one sister who was diagnosed with a melanoma between her "butt cheeks" at age 26.  Her mother was diagnosed with melanoma at age 74 and died at  42.  Her mother had three sisters and a brother.  Her brother was a "blue baby", and died at 28 months. One sister was diagnosed with breast cancer at 42 and died at 20, and another sister had mental illness and died at 50.  The patient's maternal grandmother had breast cancer in her 69s and her grandfather had prostate cancer.  The patient's paternal grandfather also had prostate cancer. Patient's maternal ancestors are of [place] and Native American descent, and paternal ancestors are of [place] descent. There is no reported Ashkenazi Jewish ancestry. There is no known consanguinity.  GENETIC COUNSELING ASSESSMENT: AFSA MEANY is a 29 y.o. female with a family history of breast cancer and melanoma which somewhat suggestive of a hereditary cancer syndrome and predisposition to cancer. We, therefore, discussed and recommended the  following at today's visit.   DISCUSSION: We reviewed the characteristics, features and inheritance patterns of hereditary cancer syndromes. We discussed BRCA mutations and PTEN mutations as risk factors for breast cancer and melanoma. Based on her maternal aunt's early age of onset of breast cancer, her aunt was at risk for a TP53 mutation, and therefore we should consider genetic testing for this condition as well.  We also discussed genetic testing, including the appropriate family members to test, the process of testing, insurance coverage and turn-around-time for results. We discussed the implications of a negative, positive and/or variant of uncertain significant result. We recommended Ms. Longnecker pursue genetic testing for the Custom gene panel. The Custome gene panel offered by [facility] includes sequencing and rearrangement analysis for the following 25 genes:  ATM, BAP1, BARD1, BRCA1, BRCA2, BRIP1, CDH1, CDK4, CDKN2A, CHEK2, EPCAM, FANCC, MITF, MLH1, MSH2, MSH6, NBN, PALB2, PMS2, PTEN, RAD51C, RAD51D, STK11, TP53, and XRCC2.     Based on Ms. Marchant family history of cancer, she meets medical criteria for genetic testing. Despite that she meets criteria, she may still have an out of pocket cost. We discussed that if her out of pocket cost for testing is over $100, the laboratory will call and confirm whether she wants to proceed with testing.  If the out of pocket cost of testing is less than $100 she will be billed by the genetic testing laboratory.   PLAN: After considering the risks, benefits, and limitations, Ms. Beery  provided informed consent to pursue genetic testing and the blood sample was sent to [facility] for analysis of the Custom Panel. Results should be available within approximately 2-3 weeks' time, at which point they will be disclosed by telephone to Ms. Siems, as will any additional recommendations warranted by these results. Ms. Fiddler will receive a summary of her genetic  counseling visit and a copy of her results once available. This information will also be available in Epic. We encouraged Ms. Felling to remain in contact with cancer genetics annually so that we can continuously update the family history and inform her of any changes in cancer genetics and testing that may be of benefit for her family. Ms. Cimmino questions were answered to her satisfaction today. Our contact information was provided should additional questions or concerns arise.  Based on Ms. Elliott family history, we recommended her sister, who was diagnosed with melanoma at age 20, have genetic counseling and testing. Ms. Meraz will let us know if we can be of any assistance in coordinating genetic counseling and/or testing for this family member.   Lastly, we encouraged Ms. Lubben to remain in contact with cancer genetics annually so that we  can continuously update the family history and inform her of any changes in cancer genetics and testing that may be of benefit for this family.   Ms.  Akkerman questions were answered to her satisfaction today. Our contact information was provided should additional questions or concerns arise. Thank you for the referral and allowing [place] to share in the care of your patient.   Tammala Weider P. Florene Glen, [place], [facility] Certified Genetic Counselor [email or web address] phone: [phone]  The patient was seen for a total of 60 minutes in face-to-face genetic counseling.  This patient was discussed with Drs. Magrinat, Lindi Adie and/or Burr Medico who agrees with the above.    _______________________________________________________________________ For Office Staff:  Number of people involved in session: 1 Was an Intern/ student involved with case: no

## 2014-09-05 ENCOUNTER — Telehealth: Payer: Self-pay | Admitting: Genetic Counselor

## 2014-09-05 ENCOUNTER — Encounter: Payer: Self-pay | Admitting: Genetic Counselor

## 2014-09-05 DIAGNOSIS — Z1379 Encounter for other screening for genetic and chromosomal anomalies: Secondary | ICD-10-CM | POA: Insufficient documentation

## 2014-09-05 NOTE — Telephone Encounter (Signed)
LM on VM that results were back and to please call back. 

## 2014-09-05 NOTE — Telephone Encounter (Signed)
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  Typically CHEK2 is not associated with melanoma, so there is a chance that her mother and sister have something else going on.  Scheduled Terrianne into the genetics clinic on Monday, June 27 at 10.

## 2014-09-08 ENCOUNTER — Ambulatory Visit (HOSPITAL_BASED_OUTPATIENT_CLINIC_OR_DEPARTMENT_OTHER): Admitting: Genetic Counselor

## 2014-09-08 ENCOUNTER — Other Ambulatory Visit: Payer: Self-pay | Admitting: Family Medicine

## 2014-09-08 ENCOUNTER — Encounter: Payer: Self-pay | Admitting: Genetic Counselor

## 2014-09-08 DIAGNOSIS — Z808 Family history of malignant neoplasm of other organs or systems: Secondary | ICD-10-CM | POA: Diagnosis not present

## 2014-09-08 DIAGNOSIS — Z1501 Genetic susceptibility to malignant neoplasm of breast: Secondary | ICD-10-CM

## 2014-09-08 DIAGNOSIS — Z1589 Genetic susceptibility to other disease: Secondary | ICD-10-CM | POA: Diagnosis not present

## 2014-09-08 DIAGNOSIS — Z1239 Encounter for other screening for malignant neoplasm of breast: Secondary | ICD-10-CM

## 2014-09-08 DIAGNOSIS — Z1509 Genetic susceptibility to other malignant neoplasm: Secondary | ICD-10-CM | POA: Insufficient documentation

## 2014-09-08 DIAGNOSIS — Z803 Family history of malignant neoplasm of breast: Secondary | ICD-10-CM | POA: Diagnosis not present

## 2014-09-08 DIAGNOSIS — Z315 Encounter for genetic counseling: Secondary | ICD-10-CM

## 2014-09-08 DIAGNOSIS — Z8042 Family history of malignant neoplasm of prostate: Secondary | ICD-10-CM

## 2014-09-08 DIAGNOSIS — IMO0002 Reserved for concepts with insufficient information to code with codable children: Secondary | ICD-10-CM | POA: Insufficient documentation

## 2014-09-08 DIAGNOSIS — Z1502 Genetic susceptibility to malignant neoplasm of ovary: Secondary | ICD-10-CM

## 2014-09-08 NOTE — Progress Notes (Signed)
REFERRING PROVIDER: Susy Frizzle, MD [address] [facility] [place], [place] [address]   Kandis Cocking, PA-C  PRIMARY PROVIDER:  Odette Fraction, MD  PRIMARY REASON FOR VISIT:  1. Monoallelic mutation of CHEK2 gene   2. Family history of breast cancer   3. Family history of melanoma   4. Family history of prostate cancer      HISTORY OF PRESENT ILLNESS:   Kathleen Osborne, a 29 y.o. female, was seen for a [place] cancer genetics consultation to discuss her genetic test results.  Please see my clinic note from August 13, 2014.  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  Kathleen Osborne presents to clinic today to discuss the possibility of a hereditary predisposition to cancer, genetic testing, and to further clarify her future cancer risks, as well as potential cancer risks for family members.   CANCER HISTORY:   No history exists.     Past Medical History  Diagnosis Date  . Heartburn in pregnancy   . Anxiety     no meds while pregnant  . Depression   . Family history of breast cancer   . Family history of melanoma   . Monoallelic mutation of CHEK2 gene     Past Surgical History  Procedure Laterality Date  . Cervical cone biopsy      2006  . Leep    . Cesarean section      NRFHR 1st Csection.  . Cesarean section N/A 10/24/2012    Procedure: REPEAT CESAREAN SECTION;  Surgeon: Shelly Bombard, MD;  Location: [facility];  Service: Obstetrics;  Laterality: N/A;    History   Social History  . Marital Status: Married    Spouse Name: N/A  . Number of Children: 2  . Years of Education: N/A   Social History Main Topics  . Smoking status: Former Smoker    Quit date: 03/14/2010  . Smokeless tobacco: Never Used  . Alcohol Use: No     Comment: Occassionally  . Drug Use: No  . Sexual Activity:    Partners: Male    Birth Control/ Protection: Condom     Comment: Spouse is out of state    Other  Topics Concern  . Not on file   Social History Narrative     FAMILY HISTORY:  We obtained a detailed, 4-generation family history.  Significant diagnoses are listed below: Family History  Problem Relation Age of Onset  . Melanoma Mother 68  . Early death Mother   . Hyperlipidemia Father   . Breast cancer Maternal Grandmother     dx in her 48s  . Parkinson's disease Maternal Grandfather   . Depression Maternal Grandfather   . Drug abuse Maternal Grandfather   . Mental illness Maternal Grandfather   . Prostate cancer Maternal Grandfather   . Breast cancer Maternal Aunt 33  . Prostate cancer Paternal Grandfather   . Melanoma Sister 37  . Mental illness Maternal Aunt    The patient has one sister who was diagnosed with a melanoma between her "butt cheeks" at age 59. Her mother was diagnosed with melanoma at age 38 and died at 2. Her mother had three sisters and a brother. Her brother was a "blue baby", and died at 57 months. One sister was diagnosed with breast cancer at 72 and died at 5, and another sister had mental illness and died at 3. The  patient's maternal grandmother had breast cancer in her 78s and her grandfather had prostate cancer. The patient's paternal grandfather also had prostate cancer. Patient's maternal ancestors are of [place] and Native American descent, and paternal ancestors are of [place] descent. There is no reported Ashkenazi Jewish ancestry. There is no known consanguinity.  GENETIC COUNSELING ASSESSMENT: Kathleen Osborne is a 29 y.o. female with a CHEK2 familial mutation called c.1100delC.  This places her at an increased risk for breast cancer and possibly other cancers.Marland Kitchen   DISCUSSION: CHEK2 mutations have been found to be associated with an increased risk of breast and other cancers. The estimated cancer risks vary widely and may be influenced by family history. Women with a CHEK2 deleterious mutation have approximately a 24% (no family history of breast  cancer) to 48% (strong family history of breast cancer) lifetime risk of breast cancer and up to a 25% risk of a second breast cancer. Men may have an increased risk for female breast cancer of about 1%. Men and women may have an increased risk of colon cancer (~10% lifetime risk). According to the NCCN guidelines, individuals with CHEK2 mutations should consider breast MRI's as a part of regular breast cancer screening.  There are no other established management guidelines for individuals with disease causing mutations in CHEK2. However, management options based on other genes associated with high risks of breast cancer (such as the BRCA1 and BRCA2) may be appropriate to follow.   CANCER SCREENING: Below are the NCCN Practice guidelines for women and men.  However, because the breast cancer risks for women and prostate cancer risks for men may be similar, it is appropriate to consider these high risk management recommendations.  Breast Management Options Based on this genetic test and her family history, Kathleen Osborne is considered to be at high risk for breast cancer.  Individuals at high risk should consider mammography and breast MRI starting at age 50, or 39 years younger than the earliest age of onset (whichever is earlier).  We reviewed the NCCN practice guidelines (v.2.2016) for breast management for women at an increased risk of breast cancer:   1. Breast awareness (which may include periodic, consistent breast self exam) starting at age 74.  2. Clinical breast exam, every 6-12 months, starting at age 49.  65. Breast screening:  . Age 67-29, annual breast MRI screening (preferred), or individualized based on earliest age of breast cancer onset in family.  . Age 52-75, annual mammogram and breast MRI screening.  . Age >75, management should be considered on an individual basis. 4. Breast MRI.   Colon Cancer Management:  Men and women with a deleterious CHEK2 mutation may have up to a 10% lifetime  risk for colon cancer. At this time, there are not any specific guidelines or recommendations based on this risk.  Individuals should discuss when to begin screening colonoscopies and how often they should be performed with a gastroenterologist.   FAMILY MEMBERS: It is important that all of Kathleen Osborne relatives (both men and women) know of the presence of this gene mutation.  Women need to know that they may be at increased risk for breast and colon cancers.  Men are at slightly increased risk for breast, prostate and colon cancers.  Genetic testing can sort out who in your family is at risk and who is not.  We would be happy to help meet with and coordinate genetic testing for any relative that is interested.  Kathleen Osborne children are at  50% risk to have inherited the mutation found in her. Kathleen Osborne children, however, are relatively young and this will not be of any consequence to them for several years. We do not test children because there is no risk to them until they are adults.  It should also be kept in mind that for children, we are bound to know a great deal more about breast cancer and its prevention in several years' time. We recommend that Kathleen Osborne's children have genetic counseling and testing by the time that they are in their early 53s.    Kathleen Osborne' sister is at 50% risk to have inherited the mutation found in her. We recommend she has genetic testing for this same mutation, as identifying the presence of this mutation would allow her to also take advantage of risk-reducing measures.   Our knowledge of cancer risks related to CHEK2 mutations will continue to evolve. We recommended that Kathleen Osborne follow up with the genetics clinic annually so we can provide her with the most current information about CHEK2 and cancer risk, as well as with any changes to her family history (new cancer diagnoses, genetic test results).  PLAN: Kathleen Osborne was provided a copy of her test results so that she  can make copies to give to at risk relatives.  We recommend making an appointment in the high risk breast clinic. A referral to this clinic can be made by the primary care provider.   Lastly, we encouraged Kathleen Osborne to remain in contact with cancer genetics annually so that we can continuously update the family history and inform her of any changes in cancer genetics and testing that may be of benefit for this family.   Ms.  Osborne questions were answered to her satisfaction today. Our contact information was provided should additional questions or concerns arise. Thank you for the referral and allowing [place] to share in the care of your patient.   Karen P. Florene Glen, [place], [facility] Certified Genetic Counselor [email or web address] phone: [phone]  The patient was seen for a total of 30 minutes in face-to-face genetic counseling.  This patient was discussed with Drs. Magrinat, Lindi Adie and/or Burr Medico who agrees with the above.    _______________________________________________________________________ For Office Staff:  Number of people involved in session: 1 Was an Intern/ student involved with case: yes

## 2014-09-09 ENCOUNTER — Ambulatory Visit: Admitting: Dietician

## 2014-09-10 ENCOUNTER — Ambulatory Visit (INDEPENDENT_AMBULATORY_CARE_PROVIDER_SITE_OTHER): Admitting: Certified Nurse Midwife

## 2014-09-10 VITALS — BP 121/78 | HR 83 | Temp 98.9°F | Ht 61.0 in | Wt 242.0 lb

## 2014-09-10 DIAGNOSIS — Z1231 Encounter for screening mammogram for malignant neoplasm of breast: Secondary | ICD-10-CM | POA: Diagnosis not present

## 2014-09-10 DIAGNOSIS — Z30430 Encounter for insertion of intrauterine contraceptive device: Secondary | ICD-10-CM | POA: Diagnosis not present

## 2014-09-10 DIAGNOSIS — Z01812 Encounter for preprocedural laboratory examination: Secondary | ICD-10-CM

## 2014-09-10 DIAGNOSIS — Z1239 Encounter for other screening for malignant neoplasm of breast: Secondary | ICD-10-CM

## 2014-09-10 LAB — POCT URINE PREGNANCY: PREG TEST UR: NEGATIVE

## 2014-09-10 NOTE — Progress Notes (Signed)
Patient ID: Kathleen Osborne, female   DOB: [birth year], 29 y.o.   MRN: [id number]  IUD Procedure Note   DIAGNOSIS: Desires long-term, reversible contraception   PROCEDURE: IUD placement Performing Provider: Kandis Cocking CNM  Patient counseled prior to procedure. I explained risks and benefits of Mirena IUD, reviewed alternative forms of contraception. Patient stated understanding and consented to continue with procedure.   LMP: unknown, around start of Febuary Pregnancy Test: Negative Lot #: [id number]  Expiration Date: 11/18   IUD type: [X]  Mirena   [   ] Paraguard    PROCEDURE:  Timeout procedure was performed to ensure right patient and right site.  A bimanual exam was performed to determine the position of the uterus, retroverted. The speculum was placed. The vagina and cervix was sterilized in the usual manner and sterile technique was maintained throughout the course of the procedure. A single toothed tenaculum was applied to the posterior lip of the cervix and gentle traction applied. The depth of the uterus was sounded to 9 cm. Tenaculum not required, the IUD was inserted to the appropriate depth and inserted without difficulty.  The string was cut to an estimated 4 cm length. Bleeding was minimal. The patient tolerated the procedure well.   Follow up: The patient tolerated the procedure well without complications.  Standard post-procedure care is explained and return precautions are given.  F/U in 1 month for string check.   Kandis Cocking CNM

## 2014-09-12 ENCOUNTER — Telehealth: Payer: Self-pay | Admitting: *Deleted

## 2014-09-12 ENCOUNTER — Ambulatory Visit: Admitting: Certified Nurse Midwife

## 2014-09-12 NOTE — Telephone Encounter (Signed)
Received referral from PCP.  Called pt and confirmed 09/29/14 high risk appt w/ her.  Mailed calendar, welcoming packet & intake form to pt.  Emailed Dr. Buelah Manis & Karen/Kayla to make them aware of the appt.

## 2014-09-30 ENCOUNTER — Ambulatory Visit (HOSPITAL_BASED_OUTPATIENT_CLINIC_OR_DEPARTMENT_OTHER): Admitting: Hematology

## 2014-09-30 ENCOUNTER — Ambulatory Visit

## 2014-09-30 ENCOUNTER — Encounter: Payer: Self-pay | Admitting: Hematology

## 2014-09-30 VITALS — BP 115/74 | HR 88 | Temp 98.3°F | Resp 18 | Ht 61.0 in | Wt 239.6 lb

## 2014-09-30 DIAGNOSIS — IMO0002 Reserved for concepts with insufficient information to code with codable children: Secondary | ICD-10-CM

## 2014-09-30 DIAGNOSIS — Z1501 Genetic susceptibility to malignant neoplasm of breast: Secondary | ICD-10-CM

## 2014-09-30 DIAGNOSIS — Z1589 Genetic susceptibility to other disease: Secondary | ICD-10-CM

## 2014-09-30 DIAGNOSIS — Z803 Family history of malignant neoplasm of breast: Secondary | ICD-10-CM | POA: Diagnosis not present

## 2014-09-30 DIAGNOSIS — Z1509 Genetic susceptibility to other malignant neoplasm: Secondary | ICD-10-CM

## 2014-09-30 DIAGNOSIS — Z1502 Genetic susceptibility to malignant neoplasm of ovary: Secondary | ICD-10-CM | POA: Diagnosis not present

## 2014-09-30 NOTE — Progress Notes (Signed)
[facility]  Telephone:([phone] Fax:([phone]) [place] Note   Patient Care Team: Susy Frizzle, MD as PCP - General (Family Medicine) 09/30/2014  CHIEF COMPLAINTS/PURPOSE OF CONSULTATION:  CHECK2 mutation carrier   HISTORY OF PRESENTING ILLNESS:  Kathleen Osborne 29 y.o. female is here because of her family history of malignancy and recently diagnosed CHECK2 mutation.  Both her mother and sister had melanoma, she also has family history of breast cancer, prostate cancer, and possible colon cancer in her second degree relatives. 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 She will was referred to my high risk clinic to discuss the management.  She is married, has 2 children at age of 23 and 59, and does not plan to have more children in the future. She feels well overall, developed skin rashes, possible related to her medication phentermine, which she takes for weight loss. She otherwise denies any pain or other symptoms. She does not exercise regularly, but remains very physically active at home.  MEDICAL HISTORY:  Past Medical History  Diagnosis Date  . Heartburn in pregnancy   . Anxiety     no meds while pregnant  . Depression   . Family history of breast cancer   . Family history of melanoma   . Monoallelic mutation of CHEK2 gene     SURGICAL HISTORY: Past Surgical History  Procedure Laterality Date  . Cervical cone biopsy      2006  . Leep    . Cesarean section      NRFHR 1st Csection.  . Cesarean section N/A 10/24/2012    Procedure: REPEAT CESAREAN SECTION;  Surgeon: Shelly Bombard, MD;  Location: [facility];  Service: Obstetrics;  Laterality: N/A;    SOCIAL HISTORY: History   Social History  . Marital Status: Married    Spouse Name: N/A  . Number of Children: 2  . Years of Education: N/A   Occupational History  . Not on file.   Social History  Main Topics  . Smoking status: Former Smoker    Quit date: 03/14/2010  . Smokeless tobacco: Never Used  . Alcohol Use: No     Comment: Occassionally  . Drug Use: No  . Sexual Activity:    Partners: Male    Birth Control/ Protection: Condom     Comment: Spouse is out of state    Other Topics Concern  . Not on file   Social History Narrative    FAMILY HISTORY: Family History  Problem Relation Age of Onset  . Melanoma Mother 73  . Early death Mother   . Hyperlipidemia Father   . Breast cancer Maternal Grandmother     dx in her 61s  . Parkinson's disease Maternal Grandfather   . Depression Maternal Grandfather   . Drug abuse Maternal Grandfather   . Mental illness Maternal Grandfather   . Prostate cancer Maternal Grandfather   . Colon cancer Maternal Grandfather   . Breast cancer Maternal Aunt 33  . Prostate cancer Paternal Grandfather   . Melanoma Sister 83  . Mental illness Maternal Aunt     ALLERGIES:  has No Known Allergies.  MEDICATIONS:  Current Outpatient Prescriptions  Medication Sig Dispense Refill  . clindamycin (CLINDAGEL) 1 % gel APPLY TO FACE ONCE OR TWICE DAILY  2  . clobetasol (OLUX) 0.05 % topical foam APPLY TWICE DAILY TO SCALP AS NEEDED  2  . ketoconazole (NIZORAL) 2 % shampoo LATHER ONTO SCALP, LEAVE ON 5-10 MINUTES RINSE WELL 2-3 TIMES A WEEK  2  . phentermine 37.5 MG capsule Take 37.5 mg by mouth every morning.    . sertraline (ZOLOFT) 50 MG tablet Take 1.5 tablets (75 mg total) by mouth daily. 45 tablet 11   No current facility-administered medications for this visit.    REVIEW OF SYSTEMS:   Constitutional: Denies fevers, chills or abnormal night sweats Eyes: Denies blurriness of vision, double vision or watery eyes Ears, nose, mouth, throat, and face: Denies mucositis or sore throat Respiratory: Denies cough, dyspnea or wheezes Cardiovascular: Denies palpitation, chest discomfort or lower extremity swelling Gastrointestinal:  Denies nausea,  heartburn or change in bowel habits Skin: Denies abnormal skin rashes Lymphatics: Denies new lymphadenopathy or easy bruising Neurological:Denies numbness, tingling or new weaknesses Behavioral/Psych: Mood is stable, no new changes  All other systems were reviewed with the patient and are negative.  PHYSICAL EXAMINATION: ECOG PERFORMANCE STATUS: 0 - Asymptomatic  Filed Vitals:   09/30/14 1523  BP: 115/74  Pulse: 88  Temp: 98.3 F (36.8 C)  Resp: 18   Filed Weights   09/30/14 1523  Weight: 239 lb 9.6 oz (108.682 kg)    GENERAL:alert, no distress and comfortable SKIN: skin color, texture, turgor are normal, (+) . Multiple papular skin rashes on her face, neck and front chest, some are ulcerated.  EYES: normal, conjunctiva are pink and non-injected, sclera clear OROPHARYNX:no exudate, no erythema and lips, buccal mucosa, and tongue normal  NECK: supple, thyroid normal size, non-tender, without nodularity LYMPH:  no palpable lymphadenopathy in the cervical, axillary or inguinal LUNGS: clear to auscultation and percussion with normal breathing effort HEART: regular rate & rhythm and no murmurs and no lower extremity edema ABDOMEN:abdomen soft, non-tender and normal bowel sounds Musculoskeletal:no cyanosis of digits and no clubbing  PSYCH: alert & oriented x 3 with fluent speech NEURO: no focal motor/sensory deficits Breasts: Breast inspection showed them to be symmetrical with no nipple discharge. Palpation of the breasts and axilla revealed no obvious mass that I could appreciate.  LABORATORY DATA:  I have reviewed the data as listed Lab Results  Component Value Date   WBC 13.5* 08/24/2013   HGB 13.5 08/24/2013   HCT 41.6 08/24/2013   MCV 83.4 08/24/2013   PLT 290 08/24/2013   No results for input(s): NA, K, CL, CO2, GLUCOSE, BUN, CREATININE, CALCIUM, GFRNONAA, GFRAA, PROT, ALBUMIN, AST, ALT, ALKPHOS, BILITOT, BILIDIR, IBILI in the last 8760 hours.  RADIOGRAPHIC  STUDIES: I have personally reviewed the radiological images as listed and agreed with the findings in the report. No results found.     ASSESSMENT & PLAN:   1.  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 The  [id number] mutation is associated with a two to threefold increased risk of breast cancer, with a 37 percent (95% CI 26-56 percent) risk of breast cancer by age 8 years. She also has positive family history of breast cancer which certainly increase her risk of breast cancer also. -We also discussed the slightly increased risk of colon and other cancer in CHECK2 mutation. At present, there is no strong evidence that CHEK2 mutations confer an increased risk of ovarian cancer. She has uncertain family history of colon cancer from her maternal grandfather, uncertain age, I recommend screening colonoscopy, considering starting at age  of 62, definitely after age of 68. -According to the NCCN guideline, no prophylactic mastectomy or oophorectomies is recommended. -We discussed annual screening MRI from now, and add mammogram after she turns to 31, which will detect early stage breast cancer. She agrees, I will schedule one for her today  -I encouraged her to have self breast exam every months after menstrual period. I encouraged her to return to see [place] or her gynecologist to have physical exam including breast exam every 6-12 months.  -We also discussed healthy diet and regular exercise, calcium and vitamin D supplement,  weight loss due to her obesity, to reduce her risk of breast cancer -I recommend her to avoid hormonal replacement after menopause.  -We further discussed chemoprevention to breast cancer. I discussed the option of tamoxifen and raloxifene. These endocrine therapy agent is likely going to reduce the risk of breast cancer by 30-40%, however there is no data of survival  benefit so far.  -The different side effects of these agents were discussed with patient in great details, such as hot flash, dry skin and vaginal dryness, risk of thrombosis and endometrial cancer. Written material were provided to her.  -She is interested in chemoprevention, however would like to discuss with her family members, and will cal me if she decides to take it.   Plan  -Bilateral breast MRI with and without contrast in the next months  -She will call me regarding her follow-up and decision on tamoxifen.    All questions were answered. The patient knows to call the clinic with any problems, questions or concerns. I spent 55 minutes counseling the patient face to face. The total time spent in the appointment was 60 minutes and more than 50% was on counseling.     Truitt Merle, MD 09/30/2014 4:09 PM

## 2014-10-01 ENCOUNTER — Telehealth: Payer: Self-pay | Admitting: Hematology

## 2014-10-01 NOTE — Telephone Encounter (Signed)
Central radiology scheduling will call patient with breast mri appointment to be done at [facility]. No other orders per 7/19 pof.

## 2014-10-10 ENCOUNTER — Ambulatory Visit: Admitting: Certified Nurse Midwife

## 2014-11-27 ENCOUNTER — Encounter: Payer: Self-pay | Admitting: Genetic Counselor

## 2014-12-31 ENCOUNTER — Ambulatory Visit (HOSPITAL_COMMUNITY): Admission: RE | Admit: 2014-12-31 | Source: Ambulatory Visit

## 2015-01-12 ENCOUNTER — Telehealth: Payer: Self-pay | Admitting: Family Medicine

## 2015-01-12 MED ORDER — DIAZEPAM 10 MG PO TABS: ORAL_TABLET | ORAL | Status: DC

## 2015-01-12 NOTE — Telephone Encounter (Signed)
Valium 10 mg po 30 minutes prior to mri.

## 2015-01-12 NOTE — Telephone Encounter (Signed)
Patient is calling to say she is having mri on the 7th and would like to know if she can have a med called in for this because she does not like to be closed up in tight spaces  [facility] [address] [place]  [phone]

## 2015-01-12 NOTE — Telephone Encounter (Signed)
Medication called/sent to requested pharmacy - pt aware via vm 

## 2015-01-19 ENCOUNTER — Ambulatory Visit (HOSPITAL_COMMUNITY)
Admission: RE | Admit: 2015-01-19 | Discharge: 2015-01-19 | Disposition: A | Discharge status: Home / Self Care | Source: Ambulatory Visit | Attending: Hematology | Admitting: Hematology

## 2015-01-19 DIAGNOSIS — Z1589 Genetic susceptibility to other disease: Secondary | ICD-10-CM | POA: Insufficient documentation

## 2015-01-19 DIAGNOSIS — Z1502 Genetic susceptibility to malignant neoplasm of ovary: Secondary | ICD-10-CM | POA: Diagnosis not present

## 2015-01-19 DIAGNOSIS — N649 Disorder of breast, unspecified: Secondary | ICD-10-CM | POA: Insufficient documentation

## 2015-01-19 DIAGNOSIS — Z1509 Genetic susceptibility to other malignant neoplasm: Secondary | ICD-10-CM | POA: Insufficient documentation

## 2015-01-19 DIAGNOSIS — Z803 Family history of malignant neoplasm of breast: Secondary | ICD-10-CM

## 2015-01-19 DIAGNOSIS — IMO0002 Reserved for concepts with insufficient information to code with codable children: Secondary | ICD-10-CM

## 2015-01-19 DIAGNOSIS — Z1239 Encounter for other screening for malignant neoplasm of breast: Secondary | ICD-10-CM | POA: Diagnosis present

## 2015-01-19 DIAGNOSIS — Z1501 Genetic susceptibility to malignant neoplasm of breast: Secondary | ICD-10-CM | POA: Insufficient documentation

## 2015-01-19 MED ORDER — GADOBENATE DIMEGLUMINE 529 MG/ML IV SOLN
20.0000 mL | Freq: Once | INTRAVENOUS | Status: AC | PRN
  Administered 2015-01-19: 20 mL via INTRAVENOUS

## 2015-01-20 ENCOUNTER — Other Ambulatory Visit: Payer: Self-pay | Admitting: Hematology

## 2015-01-20 DIAGNOSIS — Z1231 Encounter for screening mammogram for malignant neoplasm of breast: Secondary | ICD-10-CM

## 2015-01-21 ENCOUNTER — Other Ambulatory Visit: Payer: Self-pay | Admitting: Hematology

## 2015-01-21 ENCOUNTER — Telehealth: Payer: Self-pay | Admitting: *Deleted

## 2015-01-21 DIAGNOSIS — IMO0002 Reserved for concepts with insufficient information to code with codable children: Secondary | ICD-10-CM

## 2015-01-21 NOTE — Telephone Encounter (Signed)
I called patient back and discussed the result with her. The MRI showed a 1.6cm anemia non-mass enhancement in the left breast. No adenopathy. MRI guided biopsy is recommended. Patient agrees to proceed after she comes back from a trip in a few weeks.  Will send a POF to schedule the biopsy.   .me 01/21/2015

## 2015-01-21 NOTE — Telephone Encounter (Signed)
"  I had MRI breast yesterday and they told me to call today for results."  Will notify Dr. Burr Medico and staff about this request

## 2015-01-21 NOTE — Telephone Encounter (Signed)
Spoke with pt and informed pt re:  Pt will be contacted with further information after Dr. Burr Medico reviews MRI results.  Pt voiced understanding.

## 2015-01-23 ENCOUNTER — Telehealth: Payer: Self-pay | Admitting: Hematology

## 2015-01-23 ENCOUNTER — Other Ambulatory Visit: Payer: Self-pay | Admitting: Hematology

## 2015-01-23 DIAGNOSIS — IMO0002 Reserved for concepts with insufficient information to code with codable children: Secondary | ICD-10-CM

## 2015-01-23 NOTE — Telephone Encounter (Signed)
Per 11/9 pof pt needs an mri guided bx - no order. Message to YF for order. bx and f/u will be scheduled once order entered.

## 2015-01-29 ENCOUNTER — Telehealth: Payer: Self-pay | Admitting: Hematology

## 2015-01-29 NOTE — Telephone Encounter (Signed)
bx order entered. Appointment scheduled for 11/22 @ 7:30 am to arrive 7 at - [place]. Left message for patient and mailed appointment.

## 2015-02-02 ENCOUNTER — Other Ambulatory Visit: Payer: Self-pay | Admitting: *Deleted

## 2015-02-02 ENCOUNTER — Telehealth: Payer: Self-pay | Admitting: *Deleted

## 2015-02-02 DIAGNOSIS — Z803 Family history of malignant neoplasm of breast: Secondary | ICD-10-CM

## 2015-02-02 DIAGNOSIS — Z1509 Genetic susceptibility to other malignant neoplasm: Secondary | ICD-10-CM

## 2015-02-02 DIAGNOSIS — IMO0002 Reserved for concepts with insufficient information to code with codable children: Secondary | ICD-10-CM

## 2015-02-02 MED ORDER — LORAZEPAM 0.5 MG PO TABS
0.5000 mg | ORAL_TABLET | Freq: Once | ORAL | Status: DC
Start: 2015-02-02 — End: 2015-02-10

## 2015-02-02 NOTE — Telephone Encounter (Signed)
Pt called and left message about appt scheduled for 11/22.   Spoke with pt and informed pt that Breast MRI biopsy is scheduled at 730 am  On 02/03/15;  Pt needs to arrive by 7 am to [place].   Pt stated she would like to have something to help her relax prior to procedure.  Informed pt that Dr. Burr Medico will be notified, and script will be called in to pt's pharmacy.  Pt voiced understanding. Pt's  Phone    [phone].

## 2015-02-03 ENCOUNTER — Ambulatory Visit
Admission: RE | Admit: 2015-02-03 | Discharge: 2015-02-03 | Disposition: A | Discharge status: Home / Self Care | Source: Ambulatory Visit | Attending: Radiology | Admitting: Radiology

## 2015-02-03 ENCOUNTER — Other Ambulatory Visit (HOSPITAL_COMMUNITY): Payer: Self-pay | Admitting: Radiology

## 2015-02-03 ENCOUNTER — Ambulatory Visit
Admission: RE | Admit: 2015-02-03 | Discharge: 2015-02-03 | Disposition: A | Discharge status: Home / Self Care | Source: Ambulatory Visit | Attending: Hematology | Admitting: Hematology

## 2015-02-03 ENCOUNTER — Other Ambulatory Visit: Payer: Self-pay | Admitting: Hematology

## 2015-02-03 DIAGNOSIS — N6320 Unspecified lump in the left breast, unspecified quadrant: Secondary | ICD-10-CM

## 2015-02-03 DIAGNOSIS — IMO0002 Reserved for concepts with insufficient information to code with codable children: Secondary | ICD-10-CM

## 2015-02-03 HISTORY — PX: BREAST BIOPSY: SHX20

## 2015-02-03 MED ORDER — GADOBENATE DIMEGLUMINE 529 MG/ML IV SOLN: 20.0000 mL | Freq: Once | INTRAVENOUS | Status: DC | PRN

## 2015-02-04 ENCOUNTER — Telehealth: Payer: Self-pay | Admitting: Hematology

## 2015-02-04 NOTE — Telephone Encounter (Signed)
Patient called in to make her follow up appointment with dr Burr Medico and per pof patient needed to be seen 3/5 days after bx

## 2015-02-10 ENCOUNTER — Telehealth: Payer: Self-pay | Admitting: Hematology

## 2015-02-10 ENCOUNTER — Encounter: Payer: Self-pay | Admitting: Hematology

## 2015-02-10 ENCOUNTER — Ambulatory Visit (HOSPITAL_BASED_OUTPATIENT_CLINIC_OR_DEPARTMENT_OTHER): Admitting: Hematology

## 2015-02-10 VITALS — BP 126/80 | HR 94 | Temp 98.1°F | Resp 17 | Ht 61.0 in | Wt 253.7 lb

## 2015-02-10 DIAGNOSIS — Z803 Family history of malignant neoplasm of breast: Secondary | ICD-10-CM | POA: Diagnosis not present

## 2015-02-10 NOTE — Telephone Encounter (Signed)
Per pof to do referral witrh any Breat surgeon-cld left pt information on [employer] with pt info to call for appt-sent staff message as well

## 2015-02-10 NOTE — Progress Notes (Signed)
[place]  Telephone:(336) [phone] Fax:(336) [phone]  Clinic Follow Up Note   Patient Care Team: Susy Frizzle, MD as PCP - General (Family Medicine) 02/10/2015  CHIEF COMPLAINTS:  Follow-up recent breast biopsy  HISTORY OF PRESENTING ILLNESS:  Kathleen Osborne 29 y.o. female is here because of her family history of malignancy and recently diagnosed CHECK2 mutation.  Both her mother and sister had melanoma, she also has family history of breast cancer, prostate cancer, and possible colon cancer in her second degree relatives. 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 She will was referred to my high risk clinic to discuss the management.  She is married but seperated, has 2 children at age of 96 and 66, and does not plan to have more children in the future. She feels well overall, developed skin rashes, possible related to her medication phentermine, which she takes for weight loss. She otherwise denies any pain or other symptoms. She does not exercise regularly, but remains very physically active at home.  [place] returns for follow-up. She underwent bilateral breast MRI about 3 weeks ago, which showed a non-mass enhancement. She subsequently underwent MRI guided breast biopsy. She is here to discuss the biopsy results and what to do next. She feels well, denies any new symptoms.  MEDICAL HISTORY:  Past Medical History  Diagnosis Date  . Heartburn in pregnancy   . Anxiety     no meds while pregnant  . Depression   . Family history of breast cancer   . Family history of melanoma   . Monoallelic mutation of CHEK2 gene     SURGICAL HISTORY: Past Surgical History  Procedure Laterality Date  . Cervical cone biopsy      2006  . [place]    . Cesarean section      NRFHR 1st Csection.  . Cesarean section N/A 10/24/2012    Procedure: REPEAT CESAREAN SECTION;  Surgeon:  Shelly Bombard, MD;  Location: [facility];  Service: Obstetrics;  Laterality: N/A;    SOCIAL HISTORY: Social History   Social History  . Marital Status: Married    Spouse Name: N/A  . Number of Children: 2  . Years of Education: N/A   Occupational History  . Not on file.   Social History Main Topics  . Smoking status: Former Smoker    Quit date: 03/14/2010  . Smokeless tobacco: Never Used  . Alcohol Use: No     Comment: Occassionally  . Drug Use: No  . Sexual Activity:    Partners: Male    Birth Control/ Protection: Condom     Comment: Spouse is out of state    Other Topics Concern  . Not on file   Social History Narrative    FAMILY HISTORY: Family History  Problem Relation Age of Onset  . Melanoma Mother 37  . Early death Mother   . Hyperlipidemia Father   . Breast cancer Maternal Grandmother     dx in her 29s  . Parkinson's disease Maternal Grandfather   . Depression Maternal Grandfather   . Drug abuse Maternal Grandfather   . Mental illness Maternal Grandfather   . Prostate cancer Maternal Grandfather   . Colon cancer Maternal Grandfather   . Breast cancer Maternal Aunt 33  . Prostate cancer Paternal Grandfather   . Melanoma Sister 81  . Mental illness Maternal Aunt  ALLERGIES:  has No Known Allergies.  MEDICATIONS:  Current Outpatient Prescriptions  Medication Sig Dispense Refill  . sertraline (ZOLOFT) 50 MG tablet Take 1.5 tablets (75 mg total) by mouth daily. 45 tablet 11   No current facility-administered medications for this visit.    REVIEW OF SYSTEMS:   Constitutional: Denies fevers, chills or abnormal night sweats Eyes: Denies blurriness of vision, double vision or watery eyes Ears, nose, mouth, throat, and face: Denies mucositis or sore throat Respiratory: Denies cough, dyspnea or wheezes Cardiovascular: Denies palpitation, chest discomfort or lower extremity swelling Gastrointestinal:  Denies nausea, heartburn or change in bowel  habits Skin: Denies abnormal skin rashes Lymphatics: Denies new lymphadenopathy or easy bruising Neurological:Denies numbness, tingling or new weaknesses Behavioral/Psych: Mood is stable, no new changes  All other systems were reviewed with the patient and are negative.  PHYSICAL EXAMINATION: ECOG PERFORMANCE STATUS: 0 - Asymptomatic  Filed Vitals:   02/10/15 0940  BP: 126/80  Pulse: 94  Temp: 98.1 F (36.7 C)  Resp: 17   Filed Weights   02/10/15 0940  Weight: 253 lb 11.2 oz (115.078 kg)    GENERAL:alert, no distress and comfortable SKIN: skin color, texture, turgor are normal, (+) . Multiple papular skin rashes on her face, neck and front chest, some are ulcerated.  EYES: normal, conjunctiva are pink and non-injected, sclera clear OROPHARYNX:no exudate, no erythema and lips, buccal mucosa, and tongue normal  NECK: supple, thyroid normal size, non-tender, without nodularity LYMPH:  no palpable lymphadenopathy in the cervical, axillary or inguinal LUNGS: clear to auscultation and percussion with normal breathing effort HEART: regular rate & rhythm and no murmurs and no lower extremity edema ABDOMEN:abdomen soft, non-tender and normal bowel sounds Musculoskeletal:no cyanosis of digits and no clubbing  PSYCH: alert & oriented x 3 with fluent speech NEURO: no focal motor/sensory deficits Breasts: Breast inspection showed them to be symmetrical with no nipple discharge. Palpation of the breasts and axilla revealed no obvious mass that I could appreciate.  LABORATORY DATA:  I have reviewed the data as listed Lab Results  Component Value Date   WBC 13.5* 08/24/2013   HGB 13.5 08/24/2013   HCT 41.6 08/24/2013   MCV 83.4 08/24/2013   PLT 290 08/24/2013   No results for input(s): NA, K, CL, CO2, GLUCOSE, BUN, CREATININE, CALCIUM, GFRNONAA, GFRAA, PROT, ALBUMIN, AST, ALT, ALKPHOS, BILITOT, BILIDIR, IBILI in the last 8760 hours.   PATHOLOGY REPORT Diagnosis 02/03/2015  Breast,  left, needle core biopsy, 6:30 o'clock - FIBROCYSTIC CHANGES WITH ADENOSIS AND SECRETORY TYPE CHANGE. - THERE IS NO EVIDENCE OF MALIGNANCY. - SEE COMMENT. Microscopic Comment The results were called to [facility] on 02/04/2015. (JBK:ecj 02/04/2015) Enid Cutter MD  RADIOGRAPHIC STUDIES: I have personally reviewed the radiological images as listed and agreed with the findings in the report.  Mr Breast Bilateral W Wo Contrast 01/20/2015   IMPRESSION: Indeterminate 1.6 cm linear area of clumped non mass enhancement in the inferior left breast. RECOMMENDATION: MRI guided biopsy is recommended. BI-RADS CATEGORY  BI-RADS 4: Suspicious. Electronically Signed   By: Ammie Ferrier M.D.   On: 01/20/2015 12:44   Mm Diag Breast Tomo Bilateral  02/03/2015  CLINICAL DATA:  Patient has a history of a CHEK-2 gene mutation. She has a family history of breast cancer in her mother (deceased at age 17). High risk breast MRI demonstrated area of linear enhancement within the inferior left breast. MR guided biopsy performed. EXAM: DIGITAL DIAGNOSTIC BILATERAL MAMMOGRAM WITH 3D  TOMOSYNTHESIS AND CAD COMPARISON:  None ACR Breast Density Category b: There are scattered areas of fibroglandular density. FINDINGS: There is a dumbbell-shaped clip located within he left breast at the 6:30 o'clock position corresponding to the recent MR guided biopsy. The clip is in appropriate position. There are mild post biopsy changes. There is no mass, distortion, or worrisome calcification within either breast. Mammographic images were processed with CAD. IMPRESSION: 1. No findings by mammography worrisome for malignancy. 2. Appropriate positioning of clip following left breast MR guided biopsy. RECOMMENDATION: Follow-up MR guided biopsy results. I have discussed the findings and recommendations with the patient. Results were also provided in writing at the conclusion of the visit. If applicable, a reminder letter will  be sent to the patient regarding the next appointment. BI-RADS CATEGORY  2: Benign. Electronically Signed   By: Altamese Cabal M.D.   On: 02/03/2015 09:57        ASSESSMENT & PLAN:   1.  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 The  [id number] mutation is associated with a two to threefold increased risk of breast cancer, with a 37 percent (95% CI 26-56 percent) risk of breast cancer by age 46 years. She also has positive family history of breast cancer which certainly increase her risk of breast cancer also. -We also discussed the slightly increased risk of colon and other cancer in CHECK2 mutation. At present, there is no strong evidence that CHEK2 mutations confer an increased risk of ovarian cancer. She will follow-up with her gynecologist annually and discuss colon cancer surveillance. -She has uncertain family history of colon cancer from her maternal grandfather, uncertain age, I recommend screening colonoscopy, considering starting at age of 23, definitely after age of 34. -According to the NCCN guideline, no prophylactic mastectomy or oophorectomies is recommended. -I reviewed her recent breast MRI findings and the biopsy result. Unfortunately it was benign breast lesion. -Due to the stress and the cost related to breast MRI surveillance and biopsy, she has decided to have bilateral mastectomy with reconstruction. She is interested in nipple sparing mastectomy. -We discussed the different type of breast reconstruction, she prefers to use her own body tissue for a construction.  -I'll refer her to [facility], to discuss bilateral mastectomy and reconstruction.   Plan  -Surgical referral to [facility] for bilateral mastectomy.  I'll see her as needed in the future.   All questions were answered. The patient knows to call the clinic with any  problems, questions or concerns. I spent 25 minutes counseling the patient face to face. The total time spent in the appointment was 30 minutes and more than 50% was on counseling.     Truitt Merle, MD   02/10/2015 10:22 AM

## 2015-02-11 ENCOUNTER — Ambulatory Visit

## 2015-02-13 ENCOUNTER — Telehealth: Payer: Self-pay | Admitting: Hematology

## 2015-02-13 NOTE — Telephone Encounter (Signed)
cld & spoke to Kathleen Osborne([facility]) and sch pt appt-cld & spoke to pt and gave pt time & date of appt-03/02/15 @10 :30 Dr Brantley Stage

## 2015-03-02 ENCOUNTER — Ambulatory Visit: Payer: Self-pay | Admitting: Surgery

## 2015-06-30 ENCOUNTER — Ambulatory Visit (INDEPENDENT_AMBULATORY_CARE_PROVIDER_SITE_OTHER): Admitting: Family Medicine

## 2015-06-30 VITALS — BP 118/74 | HR 72 | Temp 98.5°F | Resp 16 | Ht 62.0 in | Wt 243.0 lb

## 2015-06-30 DIAGNOSIS — J038 Acute tonsillitis due to other specified organisms: Secondary | ICD-10-CM

## 2015-06-30 LAB — STREP GROUP A AG, W/REFLEX TO CULT: STREGTOCOCCUS GROUP A AG SCREEN: NOT DETECTED

## 2015-06-30 MED ORDER — AMOXICILLIN 500 MG PO CAPS: 500.0000 mg | ORAL_CAPSULE | Freq: Two times a day (BID) | ORAL | Status: DC

## 2015-06-30 MED ORDER — FIRST-DUKES MOUTHWASH MT SUSP: OROMUCOSAL | Status: DC

## 2015-06-30 NOTE — Patient Instructions (Signed)
Take antibiotics as prescribed Use mouthwash  F/U as needed

## 2015-06-30 NOTE — Progress Notes (Signed)
Patient ID: Kathleen Osborne, female   DOB: 23-Oct-1985, 30 y.o.   MRN: [id number]    Subjective:    Patient ID: Kathleen Osborne, female    DOB: 08-Aug-1985, 30 y.o.   MRN: [id number]  Patient presents for Illnesss  Patient here with recurrent sore throat. She is positive strep test a few weeks ago she was treated by the urgent care with amoxicillin her symptoms completely cleared however she had multiple exposures to her nieces and nephews in her sister who had strep throat during the holidays and now her symptoms have returned. She initially thought it was just the pollen that she was out cutting grass in the heat and actually became a little sick and stuffy she had an episode of emesis but that resolved in the next day feeling symptoms she had a scratchy throat. Now she has a burning sensation and pain with swallowing. She has not had any fever no cough no congestion otherwise.   Review Of Systems:  GEN- denies fatigue, fever, weight loss,weakness, recent illness HEENT- denies eye drainage, change in vision, nasal discharge, CVS- denies chest pain, palpitations RESP- denies SOB, cough, wheeze ABD- denies N/V, change in stools, abd pain GU- denies dysuria, hematuria, dribbling, incontinence MSK- denies joint pain, muscle aches, injury Neuro- denies headache, dizziness, syncope, seizure activity       Objective:    BP 118/74 mmHg  Pulse 72  Temp(Src) 98.5 F (36.9 C) (Oral)  Resp 16  Ht 5\' 2"  (1.575 m)  Wt 243 lb (110.224 kg)  BMI 44.43 kg/m2 GEN- NAD, alert and oriented x3 HEENT- PERRL, EOMI, non injected sclera, pink conjunctiva, MMM, oropharynx injected, pus bilat tonsls Neck- Supple,+ant LAD  CVS- RRR, no murmur RESP-CTAB Pulses- Radial 2+        Assessment & Plan:      Problem List Items Addressed This Visit    None    Visit Diagnoses    Acute tonsillitis due to other specified organisms    -  Primary    2nd infection, treat with amox, culture sent, dukes magic  mouthwash    Relevant Orders    STREP GROUP A AG, W/REFLEX TO CULT (Completed)       Note: This dictation was prepared with Dragon dictation along with smaller phrase technology. Any transcriptional errors that result from this process are unintentional.

## 2015-07-03 LAB — CULTURE, GROUP A STREP

## 2015-08-03 ENCOUNTER — Other Ambulatory Visit: Payer: Self-pay | Admitting: *Deleted

## 2015-08-03 DIAGNOSIS — F329 Major depressive disorder, single episode, unspecified: Secondary | ICD-10-CM

## 2015-08-03 DIAGNOSIS — F32A Depression, unspecified: Secondary | ICD-10-CM

## 2015-08-03 MED ORDER — SERTRALINE HCL 50 MG PO TABS: 75.0000 mg | ORAL_TABLET | Freq: Every day | ORAL | Status: DC

## 2015-08-03 NOTE — Telephone Encounter (Signed)
Received fax requesting refill on Zoloft.   Refill appropriate and filled per protocol.

## 2015-10-19 ENCOUNTER — Telehealth: Payer: Self-pay | Admitting: *Deleted

## 2015-10-19 NOTE — Telephone Encounter (Signed)
Patient called to ask who should she see for a new breast lump in her left breast that she just discovered - Dr. Burr Medico or surgeon who is going to do her mastectomies.   Discussed with patient and she should see the surgeon.

## 2015-11-06 ENCOUNTER — Ambulatory Visit: Admitting: Hematology

## 2015-11-06 ENCOUNTER — Telehealth: Payer: Self-pay | Admitting: Hematology

## 2015-11-06 NOTE — Progress Notes (Deleted)
[place]  Telephone:(336) [phone] Fax:(336) [phone]  Clinic Follow Up Note   Patient Care Team: Susy Frizzle, MD as PCP - General (Family Medicine) 11/06/2015  CHIEF COMPLAINTS:  Follow-up recent breast biopsy  HISTORY OF PRESENTING ILLNESS:  Kathleen Osborne 30 y.o. female is here because of her family history of malignancy and recently diagnosed CHECK2 mutation.  Both her mother and sister had melanoma, she also has family history of breast cancer, prostate cancer, and possible colon cancer in her second degree relatives. 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 She will was referred to my high risk clinic to discuss the management.  She is married but seperated, has 2 children at age of 50 and 32, and does not plan to have more children in the future. She feels well overall, developed skin rashes, possible related to her medication phentermine, which she takes for weight loss. She otherwise denies any pain or other symptoms. She does not exercise regularly, but remains very physically active at home.  [place] returns for follow-up. She underwent bilateral breast MRI about 3 weeks ago, which showed a non-mass enhancement. She subsequently underwent MRI guided breast biopsy. She is here to discuss the biopsy results and what to do next. She feels well, denies any new symptoms.  MEDICAL HISTORY:  Past Medical History:  Diagnosis Date  . Anxiety    no meds while pregnant  . Depression   . Family history of breast cancer   . Family history of melanoma   . Heartburn in pregnancy   . Monoallelic mutation of CHEK2 gene     SURGICAL HISTORY: Past Surgical History:  Procedure Laterality Date  . CERVICAL CONE BIOPSY     2006  . CESAREAN SECTION     NRFHR 1st Csection.  Marland Kitchen CESAREAN SECTION N/A 10/24/2012   Procedure: REPEAT CESAREAN SECTION;  Surgeon: Shelly Bombard, MD;  Location: [facility];  Service: Obstetrics;  Laterality: N/A;  . LEEP      SOCIAL HISTORY: Social History   Social History  . Marital status: Married    Spouse name: N/A  . Number of children: 2  . Years of education: N/A   Occupational History  . Not on file.   Social History Main Topics  . Smoking status: Former Smoker    Quit date: 03/14/2010  . Smokeless tobacco: Never Used  . Alcohol use No     Comment: Occassionally  . Drug use: No  . Sexual activity: Not Currently    Partners: Male    Birth control/ protection: Condom     Comment: Spouse is out of state    Other Topics Concern  . Not on file   Social History Narrative  . No narrative on file    FAMILY HISTORY: Family History  Problem Relation Age of Onset  . Melanoma Mother 56  . Early death Mother   . Hyperlipidemia Father   . Breast cancer Maternal Grandmother     dx in her 64s  . Parkinson's disease Maternal Grandfather   . Depression Maternal Grandfather   . Drug abuse Maternal Grandfather   . Mental illness Maternal Grandfather   . Prostate cancer Maternal Grandfather   . Colon cancer Maternal Grandfather   . Breast cancer Maternal Aunt 33  . Prostate cancer Paternal Grandfather   . Melanoma Sister 8  . Mental illness Maternal Aunt  ALLERGIES:  has No Known Allergies.  MEDICATIONS:  Current Outpatient Prescriptions  Medication Sig Dispense Refill  . amoxicillin (AMOXIL) 500 MG capsule Take 1 capsule (500 mg total) by mouth 2 (two) times daily. 20 capsule 0  . Diphenhyd-Hydrocort-Nystatin (FIRST-DUKES MOUTHWASH) SUSP Swish and swallow 1 teaspoon four times a day as needed 1 Bottle 0  . levonorgestrel (MIRENA) 20 MCG/24HR IUD by Intrauterine route.    . sertraline (ZOLOFT) 50 MG tablet Take 1.5 tablets (75 mg total) by mouth daily. 45 tablet 11   No current facility-administered medications for this visit.     REVIEW OF SYSTEMS:   Constitutional: Denies fevers, chills or  abnormal night sweats Eyes: Denies blurriness of vision, double vision or watery eyes Ears, nose, mouth, throat, and face: Denies mucositis or sore throat Respiratory: Denies cough, dyspnea or wheezes Cardiovascular: Denies palpitation, chest discomfort or lower extremity swelling Gastrointestinal:  Denies nausea, heartburn or change in bowel habits Skin: Denies abnormal skin rashes Lymphatics: Denies new lymphadenopathy or easy bruising Neurological:Denies numbness, tingling or new weaknesses Behavioral/Psych: Mood is stable, no new changes  All other systems were reviewed with the patient and are negative.  PHYSICAL EXAMINATION: ECOG PERFORMANCE STATUS: 0 - Asymptomatic  There were no vitals filed for this visit. There were no vitals filed for this visit.  GENERAL:alert, no distress and comfortable SKIN: skin color, texture, turgor are normal, (+) . Multiple papular skin rashes on her face, neck and front chest, some are ulcerated.  EYES: normal, conjunctiva are pink and non-injected, sclera clear OROPHARYNX:no exudate, no erythema and lips, buccal mucosa, and tongue normal  NECK: supple, thyroid normal size, non-tender, without nodularity LYMPH:  no palpable lymphadenopathy in the cervical, axillary or inguinal LUNGS: clear to auscultation and percussion with normal breathing effort HEART: regular rate & rhythm and no murmurs and no lower extremity edema ABDOMEN:abdomen soft, non-tender and normal bowel sounds Musculoskeletal:no cyanosis of digits and no clubbing  PSYCH: alert & oriented x 3 with fluent speech NEURO: no focal motor/sensory deficits Breasts: Breast inspection showed them to be symmetrical with no nipple discharge. Palpation of the breasts and axilla revealed no obvious mass that I could appreciate.  LABORATORY DATA:  I have reviewed the data as listed Lab Results  Component Value Date   WBC 13.5 (H) 08/24/2013   HGB 13.5 08/24/2013   HCT 41.6 08/24/2013   MCV  83.4 08/24/2013   PLT 290 08/24/2013   No results for input(s): NA, K, CL, CO2, GLUCOSE, BUN, CREATININE, CALCIUM, GFRNONAA, GFRAA, PROT, ALBUMIN, AST, ALT, ALKPHOS, BILITOT, BILIDIR, IBILI in the last 8760 hours.   PATHOLOGY REPORT Diagnosis 02/03/2015  Breast, left, needle core biopsy, 6:30 o'clock - FIBROCYSTIC CHANGES WITH ADENOSIS AND SECRETORY TYPE CHANGE. - THERE IS NO EVIDENCE OF MALIGNANCY. - SEE COMMENT. Microscopic Comment The results were called to [facility] on 02/04/2015. (JBK:ecj 02/04/2015) Enid Cutter MD  RADIOGRAPHIC STUDIES: I have personally reviewed the radiological images as listed and agreed with the findings in the report.  Mr Breast Bilateral W Wo Contrast 01/20/2015   IMPRESSION: Indeterminate 1.6 cm linear area of clumped non mass enhancement in the inferior left breast. RECOMMENDATION: MRI guided biopsy is recommended. BI-RADS CATEGORY  BI-RADS 4: Suspicious. Electronically Signed   By: Ammie Ferrier M.D.   On: 01/20/2015 12:44   Mm Diag Breast Tomo Bilateral  02/03/2015  CLINICAL DATA:  Patient has a history of a CHEK-2 gene mutation. She has a family history of breast  cancer in her mother (deceased at age 5). High risk breast MRI demonstrated area of linear enhancement within the inferior left breast. MR guided biopsy performed. EXAM: DIGITAL DIAGNOSTIC BILATERAL MAMMOGRAM WITH 3D TOMOSYNTHESIS AND CAD COMPARISON:  None ACR Breast Density Category b: There are scattered areas of fibroglandular density. FINDINGS: There is a dumbbell-shaped clip located within he left breast at the 6:30 o'clock position corresponding to the recent MR guided biopsy. The clip is in appropriate position. There are mild post biopsy changes. There is no mass, distortion, or worrisome calcification within either breast. Mammographic images were processed with CAD. IMPRESSION: 1. No findings by mammography worrisome for malignancy. 2. Appropriate positioning of  clip following left breast MR guided biopsy. RECOMMENDATION: Follow-up MR guided biopsy results. I have discussed the findings and recommendations with the patient. Results were also provided in writing at the conclusion of the visit. If applicable, a reminder letter will be sent to the patient regarding the next appointment. BI-RADS CATEGORY  2: Benign. Electronically Signed   By: Altamese Cabal M.D.   On: 02/03/2015 09:57        ASSESSMENT & PLAN:   1.  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 The  [id number] mutation is associated with a two to threefold increased risk of breast cancer, with a 37 percent (95% CI 26-56 percent) risk of breast cancer by age 95 years. She also has positive family history of breast cancer which certainly increase her risk of breast cancer also. -We also discussed the slightly increased risk of colon and other cancer in CHECK2 mutation. At present, there is no strong evidence that CHEK2 mutations confer an increased risk of ovarian cancer. She will follow-up with her gynecologist annually and discuss colon cancer surveillance. -She has uncertain family history of colon cancer from her maternal grandfather, uncertain age, I recommend screening colonoscopy, considering starting at age of 78, definitely after age of 6. -According to the NCCN guideline, no prophylactic mastectomy or oophorectomies is recommended. -I reviewed her recent breast MRI findings and the biopsy result. Unfortunately it was benign breast lesion. -Due to the stress and the cost related to breast MRI surveillance and biopsy, she has decided to have bilateral mastectomy with reconstruction. She is interested in nipple sparing mastectomy. -We discussed the different type of breast reconstruction, she prefers to use her own body tissue for a construction.  -I'll refer her to [facility], to discuss bilateral mastectomy and reconstruction.   Plan  -Surgical referral to [facility] for bilateral mastectomy.  I'll see her as needed in the future.   All questions were answered. The patient knows to call the clinic with any problems, questions or concerns. I spent 25 minutes counseling the patient face to face. The total time spent in the appointment was 30 minutes and more than 50% was on counseling.     Truitt Merle, MD   11/06/2015 7:10 AM

## 2015-11-06 NOTE — Telephone Encounter (Signed)
11/06/2015 APPOINTMENT CANCELED PER PATIENT REQUEST. PATIENT WAS TOLD SHE NEEDED A REFERRAL TO SEE DR. FENG. PATIENT CALLED WITH INTENT TO CANCEL APPOINTMENT AND RESCHEDULE FOLLOWING RECEIVING REFERRAL.

## 2016-06-28 ENCOUNTER — Encounter: Payer: Self-pay | Admitting: Obstetrics and Gynecology

## 2016-06-28 ENCOUNTER — Ambulatory Visit (INDEPENDENT_AMBULATORY_CARE_PROVIDER_SITE_OTHER): Payer: Medicaid Other | Admitting: Obstetrics and Gynecology

## 2016-06-28 DIAGNOSIS — Z30432 Encounter for removal of intrauterine contraceptive device: Secondary | ICD-10-CM

## 2016-06-28 NOTE — Progress Notes (Signed)
Patient is in the office for IUD removal- she complains of weight gain, acne increase body wide, cycles every 2 weeks. Patient states she has had these symptoms since insertion 2 years ago.

## 2016-06-28 NOTE — Progress Notes (Signed)
Patient ID: Kathleen Osborne, female   DOB: 07/22/1985, 31 y.o.   MRN: [id number]  As noted. Kathleen Osborne presents for removal of IUD d/t irregular bleeding, wt, and acne. She attributes these Sx to the Mirena which was placed in June 2016.  Procedure Speculum was placed. IUD string was noted. Grasped and removed without problems Pt tolerated well.  A/P IUD removal  Contraception options reviewed with pt. Pt uncertain at this time. Advised to use condoms. [employer] information provided to pt. F/U PRN

## 2016-07-08 ENCOUNTER — Encounter: Payer: Self-pay | Admitting: Family Medicine

## 2016-08-09 ENCOUNTER — Other Ambulatory Visit: Payer: Self-pay | Admitting: Family Medicine

## 2016-08-09 DIAGNOSIS — F329 Major depressive disorder, single episode, unspecified: Secondary | ICD-10-CM

## 2016-08-09 DIAGNOSIS — F32A Depression, unspecified: Secondary | ICD-10-CM

## 2016-09-29 ENCOUNTER — Encounter: Payer: Self-pay | Admitting: Family Medicine

## 2016-12-07 DIAGNOSIS — N911 Secondary amenorrhea: Secondary | ICD-10-CM | POA: Diagnosis not present

## 2016-12-07 DIAGNOSIS — R102 Pelvic and perineal pain: Secondary | ICD-10-CM | POA: Diagnosis not present

## 2017-04-09 DIAGNOSIS — J029 Acute pharyngitis, unspecified: Secondary | ICD-10-CM | POA: Diagnosis not present

## 2017-04-27 DIAGNOSIS — N390 Urinary tract infection, site not specified: Secondary | ICD-10-CM | POA: Diagnosis not present

## 2017-06-14 DIAGNOSIS — Z719 Counseling, unspecified: Secondary | ICD-10-CM | POA: Diagnosis not present

## 2017-06-26 ENCOUNTER — Ambulatory Visit: Admitting: Family Medicine

## 2017-06-26 ENCOUNTER — Other Ambulatory Visit: Payer: Self-pay

## 2017-06-26 ENCOUNTER — Encounter: Payer: Self-pay | Admitting: Family Medicine

## 2017-06-26 VITALS — BP 122/68 | HR 86 | Temp 98.7°F | Resp 14 | Ht 62.0 in | Wt 249.0 lb

## 2017-06-26 DIAGNOSIS — J0391 Acute recurrent tonsillitis, unspecified: Secondary | ICD-10-CM | POA: Diagnosis not present

## 2017-06-26 DIAGNOSIS — J029 Acute pharyngitis, unspecified: Secondary | ICD-10-CM | POA: Diagnosis not present

## 2017-06-26 MED ORDER — FLUCONAZOLE 150 MG PO TABS: 150.0000 mg | ORAL_TABLET | Freq: Once | ORAL | 1 refills | Status: AC

## 2017-06-26 MED ORDER — FIRST-DUKES MOUTHWASH MT SUSP: OROMUCOSAL | 0 refills | Status: DC

## 2017-06-26 MED ORDER — AMOXICILLIN-POT CLAVULANATE 875-125 MG PO TABS: 1.0000 | ORAL_TABLET | Freq: Two times a day (BID) | ORAL | 0 refills | Status: DC

## 2017-06-26 NOTE — Patient Instructions (Signed)
F/u AS needed  Give note for work for today

## 2017-06-26 NOTE — Progress Notes (Signed)
   Subjective:    Patient ID: Kathleen Osborne, female    DOB: 1985-10-07, 32 y.o.   MRN: [id number]  Patient presents for Sore Throat (x1 day- sore throat, swollen tonsil on L side- would also like referral to ENT)  Pt here with sore throat, last seen in April 2017,she had been seen multiple times before that for tonsillitis   Has been treated at [facility] in [place] a few times Had 1 positive strep per report. In the past also had ear infection/sinus infection.  Sore throat started yesterday morning, on the left side, no fever  Treated with salt water gargle, and throat lozenges, Ibuprofen   Hoarse voice, when she tries to talk louder has more pain  Missed work today   Always tends to affect left side   Review Of Systems:  GEN- denies fatigue, fever, weight loss,weakness, recent illness HEENT- denies eye drainage, change in vision, nasal discharge, CVS- denies chest pain, palpitations RESP- denies SOB, cough, wheeze Neuro- denies headache, dizziness, syncope, seizure activity       Objective:    BP 122/68   Pulse 86   Temp 98.7 F (37.1 C) (Oral)   Resp 14   Ht 5\' 2"  (1.575 m)   Wt 249 lb (112.9 kg)   SpO2 98%   BMI 45.54 kg/m  GEN- NAD, alert and oriented x3 HEENT- PERRL, EOMI, non injected sclera, pink conjunctiva, MMM, oropharynx injected, enlarged tonsils L >R , mild exudate on left side , TM clear bilat no effusion  Neck- Supple+ cervical and submandibular LAD  CVS- RRR, no murmur RESP-CTAB   Strep neg      Assessment & Plan:      Problem List Items Addressed This Visit    None    Visit Diagnoses    Recurrent tonsillitis    -  Primary   recurrent symptoms, augmentin, magic mouthwash, referral to ENT , diflucan due to yeast infections with antibiotics   Relevant Orders   STREP GROUP A AG, W/REFLEX TO CULT   Ambulatory referral to ENT      Note: This dictation was prepared with Dragon dictation along with smaller phrase technology. Any  transcriptional errors that result from this process are unintentional.

## 2017-06-28 ENCOUNTER — Telehealth: Payer: Self-pay | Admitting: *Deleted

## 2017-06-28 LAB — CULTURE, GROUP A STREP
MICRO NUMBER: 90460159
SPECIMEN QUALITY: ADEQUATE

## 2017-06-28 LAB — STREP GROUP A AG, W/REFLEX TO CULT: STREPTOCOCCUS, GROUP A SCREEN (DIRECT): NOT DETECTED

## 2017-06-28 NOTE — Telephone Encounter (Signed)
If this is backorder, she can use chloroseptic and salt water gargle Take the antibiotics, ibuprofen

## 2017-06-28 NOTE — Telephone Encounter (Signed)
Received fax requesting alternative to Dukes. Nystatin is on back order.   MD please advise.

## 2017-06-29 NOTE — Telephone Encounter (Signed)
Call placed to patient. LMTRC.  

## 2017-07-03 NOTE — Telephone Encounter (Signed)
Call placed to patient. LMTRC.  

## 2017-07-04 ENCOUNTER — Other Ambulatory Visit: Payer: Self-pay | Admitting: Otolaryngology

## 2017-07-04 DIAGNOSIS — J3503 Chronic tonsillitis and adenoiditis: Secondary | ICD-10-CM | POA: Diagnosis not present

## 2017-07-04 DIAGNOSIS — J353 Hypertrophy of tonsils with hypertrophy of adenoids: Secondary | ICD-10-CM | POA: Diagnosis not present

## 2017-07-04 NOTE — Telephone Encounter (Signed)
Multiple calls placed to patient with no answer and no return call.   Message to be closed.  

## 2017-08-15 ENCOUNTER — Encounter (HOSPITAL_BASED_OUTPATIENT_CLINIC_OR_DEPARTMENT_OTHER): Payer: Self-pay | Admitting: *Deleted

## 2017-08-15 ENCOUNTER — Other Ambulatory Visit: Payer: Self-pay

## 2017-08-16 ENCOUNTER — Encounter (HOSPITAL_BASED_OUTPATIENT_CLINIC_OR_DEPARTMENT_OTHER)
Admission: RE | Admit: 2017-08-16 | Discharge: 2017-08-16 | Disposition: A | Discharge status: Home / Self Care | Payer: Self-pay | Source: Ambulatory Visit | Attending: Otolaryngology | Admitting: Otolaryngology

## 2017-08-16 NOTE — Progress Notes (Signed)
Anesthesia consult per Dr. R. Fitzgerald, will proceed with surgery as scheduled.  

## 2017-08-16 NOTE — Pre-Procedure Instructions (Signed)
Pt needs Anesthesia Consult due to BMI 45 .

## 2017-08-22 ENCOUNTER — Encounter (HOSPITAL_BASED_OUTPATIENT_CLINIC_OR_DEPARTMENT_OTHER)
Admission: RE | Disposition: A | Discharge status: Home / Self Care | Payer: Self-pay | Source: Ambulatory Visit | Attending: Otolaryngology

## 2017-08-22 ENCOUNTER — Other Ambulatory Visit: Payer: Self-pay

## 2017-08-22 ENCOUNTER — Ambulatory Visit (HOSPITAL_BASED_OUTPATIENT_CLINIC_OR_DEPARTMENT_OTHER): Payer: 59 | Admitting: Anesthesiology

## 2017-08-22 ENCOUNTER — Encounter (HOSPITAL_BASED_OUTPATIENT_CLINIC_OR_DEPARTMENT_OTHER): Payer: Self-pay

## 2017-08-22 ENCOUNTER — Ambulatory Visit (HOSPITAL_BASED_OUTPATIENT_CLINIC_OR_DEPARTMENT_OTHER)
Admission: RE | Admit: 2017-08-22 | Discharge: 2017-08-22 | Disposition: A | Discharge status: Home / Self Care | Payer: 59 | Source: Ambulatory Visit | Attending: Otolaryngology | Admitting: Otolaryngology

## 2017-08-22 DIAGNOSIS — Z6841 Body Mass Index (BMI) 40.0 and over, adult: Secondary | ICD-10-CM | POA: Diagnosis not present

## 2017-08-22 DIAGNOSIS — F329 Major depressive disorder, single episode, unspecified: Secondary | ICD-10-CM | POA: Insufficient documentation

## 2017-08-22 DIAGNOSIS — E669 Obesity, unspecified: Secondary | ICD-10-CM | POA: Diagnosis not present

## 2017-08-22 DIAGNOSIS — J029 Acute pharyngitis, unspecified: Secondary | ICD-10-CM | POA: Diagnosis not present

## 2017-08-22 DIAGNOSIS — R196 Halitosis: Secondary | ICD-10-CM | POA: Diagnosis not present

## 2017-08-22 DIAGNOSIS — J353 Hypertrophy of tonsils with hypertrophy of adenoids: Secondary | ICD-10-CM | POA: Diagnosis present

## 2017-08-22 DIAGNOSIS — J312 Chronic pharyngitis: Secondary | ICD-10-CM | POA: Insufficient documentation

## 2017-08-22 DIAGNOSIS — J3501 Chronic tonsillitis: Secondary | ICD-10-CM | POA: Diagnosis not present

## 2017-08-22 DIAGNOSIS — Z87891 Personal history of nicotine dependence: Secondary | ICD-10-CM | POA: Diagnosis not present

## 2017-08-22 DIAGNOSIS — J3503 Chronic tonsillitis and adenoiditis: Secondary | ICD-10-CM | POA: Diagnosis not present

## 2017-08-22 DIAGNOSIS — J351 Hypertrophy of tonsils: Secondary | ICD-10-CM | POA: Diagnosis not present

## 2017-08-22 DIAGNOSIS — F419 Anxiety disorder, unspecified: Secondary | ICD-10-CM | POA: Diagnosis not present

## 2017-08-22 HISTORY — PX: TONSILLECTOMY AND ADENOIDECTOMY: SHX28

## 2017-08-22 LAB — POCT PREGNANCY, URINE: PREG TEST UR: NEGATIVE

## 2017-08-22 SURGERY — TONSILLECTOMY AND ADENOIDECTOMY
Anesthesia: General | Site: Throat | Laterality: Bilateral

## 2017-08-22 MED ORDER — MEPERIDINE HCL 25 MG/ML IJ SOLN: 6.2500 mg | INTRAMUSCULAR | Status: DC | PRN

## 2017-08-22 MED ORDER — ONDANSETRON HCL 4 MG/2ML IJ SOLN
INTRAMUSCULAR | Status: DC | PRN
  Administered 2017-08-22: 4 mg via INTRAVENOUS

## 2017-08-22 MED ORDER — OXYCODONE HCL 5 MG PO TABS: 5.0000 mg | ORAL_TABLET | Freq: Once | ORAL | Status: AC | PRN

## 2017-08-22 MED ORDER — ESMOLOL HCL 100 MG/10ML IV SOLN
INTRAVENOUS | Status: DC | PRN
  Administered 2017-08-22: 30 mg via INTRAVENOUS

## 2017-08-22 MED ORDER — FENTANYL CITRATE (PF) 100 MCG/2ML IJ SOLN
50.0000 ug | INTRAMUSCULAR | Status: DC | PRN
  Administered 2017-08-22 (×2): 100 ug via INTRAVENOUS

## 2017-08-22 MED ORDER — LACTATED RINGERS IV SOLN: INTRAVENOUS | Status: DC

## 2017-08-22 MED ORDER — PROMETHAZINE HCL 25 MG/ML IJ SOLN
6.2500 mg | INTRAMUSCULAR | Status: DC | PRN
  Administered 2017-08-22: 6.25 mg via INTRAVENOUS

## 2017-08-22 MED ORDER — DEXAMETHASONE SODIUM PHOSPHATE 4 MG/ML IJ SOLN
INTRAMUSCULAR | Status: DC | PRN
  Administered 2017-08-22: 10 mg via INTRAVENOUS

## 2017-08-22 MED ORDER — HYDROMORPHONE HCL 1 MG/ML IJ SOLN
INTRAMUSCULAR | Status: AC
  Filled 2017-08-22: qty 0.5

## 2017-08-22 MED ORDER — SODIUM CHLORIDE 0.9 % IR SOLN
Status: DC | PRN
  Administered 2017-08-22: 250 mL

## 2017-08-22 MED ORDER — GLYCOPYRROLATE 0.2 MG/ML IJ SOLN
INTRAMUSCULAR | Status: DC | PRN
  Administered 2017-08-22: .8 mg via INTRAVENOUS

## 2017-08-22 MED ORDER — ROCURONIUM BROMIDE 10 MG/ML (PF) SYRINGE
PREFILLED_SYRINGE | INTRAVENOUS | Status: AC
Start: 2017-08-22 — End: ?
  Filled 2017-08-22: qty 10

## 2017-08-22 MED ORDER — OXYCODONE HCL 5 MG/5ML PO SOLN: 5.0000 mg | ORAL | 0 refills | Status: DC | PRN

## 2017-08-22 MED ORDER — DEXAMETHASONE SODIUM PHOSPHATE 10 MG/ML IJ SOLN
INTRAMUSCULAR | Status: AC
  Filled 2017-08-22: qty 1

## 2017-08-22 MED ORDER — PROPOFOL 10 MG/ML IV BOLUS
INTRAVENOUS | Status: AC
  Filled 2017-08-22: qty 20

## 2017-08-22 MED ORDER — AMOXICILLIN 400 MG/5ML PO SUSR: 800.0000 mg | Freq: Two times a day (BID) | ORAL | 0 refills | Status: AC

## 2017-08-22 MED ORDER — FENTANYL CITRATE (PF) 100 MCG/2ML IJ SOLN
INTRAMUSCULAR | Status: AC
  Filled 2017-08-22: qty 2

## 2017-08-22 MED ORDER — OXYCODONE HCL 5 MG/5ML PO SOLN
ORAL | Status: AC
  Filled 2017-08-22: qty 5

## 2017-08-22 MED ORDER — PROMETHAZINE HCL 25 MG/ML IJ SOLN
INTRAMUSCULAR | Status: AC
  Filled 2017-08-22: qty 1

## 2017-08-22 MED ORDER — ONDANSETRON HCL 4 MG/2ML IJ SOLN
INTRAMUSCULAR | Status: AC
Start: 2017-08-22 — End: ?
  Filled 2017-08-22: qty 2

## 2017-08-22 MED ORDER — NEOSTIGMINE METHYLSULFATE 10 MG/10ML IV SOLN
INTRAVENOUS | Status: DC | PRN
  Administered 2017-08-22: 4 mg via INTRAVENOUS

## 2017-08-22 MED ORDER — LIDOCAINE HCL (CARDIAC) PF 100 MG/5ML IV SOSY
PREFILLED_SYRINGE | INTRAVENOUS | Status: DC | PRN
  Administered 2017-08-22: 70 mg via INTRAVENOUS

## 2017-08-22 MED ORDER — MIDAZOLAM HCL 2 MG/2ML IJ SOLN: 1.0000 mg | INTRAMUSCULAR | Status: DC | PRN

## 2017-08-22 MED ORDER — LIDOCAINE HCL (CARDIAC) PF 100 MG/5ML IV SOSY
PREFILLED_SYRINGE | INTRAVENOUS | Status: AC
  Filled 2017-08-22: qty 5

## 2017-08-22 MED ORDER — LACTATED RINGERS IV SOLN
INTRAVENOUS | Status: DC
  Administered 2017-08-22: 08:00:00 via INTRAVENOUS

## 2017-08-22 MED ORDER — PROPOFOL 10 MG/ML IV BOLUS
INTRAVENOUS | Status: DC | PRN
  Administered 2017-08-22: 180 mg via INTRAVENOUS

## 2017-08-22 MED ORDER — SCOPOLAMINE 1 MG/3DAYS TD PT72: 1.0000 | MEDICATED_PATCH | Freq: Once | TRANSDERMAL | Status: DC | PRN

## 2017-08-22 MED ORDER — ROCURONIUM BROMIDE 100 MG/10ML IV SOLN
INTRAVENOUS | Status: DC | PRN
  Administered 2017-08-22: 40 mg via INTRAVENOUS

## 2017-08-22 MED ORDER — OXYCODONE HCL 5 MG/5ML PO SOLN
5.0000 mg | Freq: Once | ORAL | Status: AC | PRN
  Administered 2017-08-22: 5 mg via ORAL

## 2017-08-22 MED ORDER — HYDROMORPHONE HCL 1 MG/ML IJ SOLN
0.2500 mg | INTRAMUSCULAR | Status: DC | PRN
  Administered 2017-08-22 (×2): 0.5 mg via INTRAVENOUS

## 2017-08-22 SURGICAL SUPPLY — 31 items
BANDAGE COBAN STERILE 2 (GAUZE/BANDAGES/DRESSINGS) IMPLANT
CANISTER SUCT 1200ML W/VALVE (MISCELLANEOUS) ×2 IMPLANT
CATH ROBINSON RED A/P 10FR (CATHETERS) IMPLANT
CATH ROBINSON RED A/P 14FR (CATHETERS) ×1 IMPLANT
COAGULATOR SUCT SWTCH 10FR 6 (ELECTROSURGICAL) IMPLANT
COVER BACK TABLE 60X90IN (DRAPES) ×2 IMPLANT
COVER MAYO STAND STRL (DRAPES) ×2 IMPLANT
ELECT REM PT RETURN 9FT ADLT (ELECTROSURGICAL) ×2
ELECT REM PT RETURN 9FT PED (ELECTROSURGICAL)
ELECTRODE REM PT RETRN 9FT PED (ELECTROSURGICAL) IMPLANT
ELECTRODE REM PT RTRN 9FT ADLT (ELECTROSURGICAL) IMPLANT
GAUZE SPONGE 4X4 12PLY STRL LF (GAUZE/BANDAGES/DRESSINGS) ×2 IMPLANT
GLOVE BIO SURGEON STRL SZ 6.5 (GLOVE) ×1 IMPLANT
GLOVE BIO SURGEON STRL SZ7 (GLOVE) ×1 IMPLANT
GLOVE BIO SURGEON STRL SZ7.5 (GLOVE) ×2 IMPLANT
GOWN STRL REUS W/ TWL LRG LVL3 (GOWN DISPOSABLE) ×2 IMPLANT
GOWN STRL REUS W/TWL LRG LVL3 (GOWN DISPOSABLE) ×6
IV NS 500ML (IV SOLUTION) ×2
IV NS 500ML BAXH (IV SOLUTION) ×1 IMPLANT
MARKER SKIN DUAL TIP RULER LAB (MISCELLANEOUS) IMPLANT
NS IRRIG 1000ML POUR BTL (IV SOLUTION) ×2 IMPLANT
SHEET MEDIUM DRAPE 40X70 STRL (DRAPES) ×2 IMPLANT
SOLUTION BUTLER CLEAR DIP (MISCELLANEOUS) ×2 IMPLANT
SPONGE TONSIL 1 RF SGL (DISPOSABLE) IMPLANT
SPONGE TONSIL TAPE 1.25 RFD (DISPOSABLE) ×2 IMPLANT
SYR BULB 3OZ (MISCELLANEOUS) IMPLANT
TOWEL GREEN STERILE FF (TOWEL DISPOSABLE) ×2 IMPLANT
TUBE CONNECTING 20X1/4 (TUBING) ×2 IMPLANT
TUBE SALEM SUMP 12R W/ARV (TUBING) IMPLANT
TUBE SALEM SUMP 16 FR W/ARV (TUBING) ×1 IMPLANT
WAND COBLATOR 70 EVAC XTRA (SURGICAL WAND) ×2 IMPLANT

## 2017-08-22 NOTE — Anesthesia Preprocedure Evaluation (Addendum)
Anesthesia Evaluation  Patient identified by MRN, date of birth, ID band Patient awake    Reviewed: Allergy & Precautions, NPO status , Patient's Chart, lab work & pertinent test results  Airway Mallampati: I  TM Distance: >3 FB Neck ROM: Full    Dental  (+) Teeth Intact, Dental Advisory Given   Pulmonary former smoker,    breath sounds clear to auscultation       Cardiovascular negative cardio ROS   Rhythm:Regular Rate:Normal     Neuro/Psych PSYCHIATRIC DISORDERS Anxiety Depression    GI/Hepatic negative GI ROS, Neg liver ROS,   Endo/Other  negative endocrine ROS  Renal/GU negative Renal ROS     Musculoskeletal negative musculoskeletal ROS (+)   Abdominal (+) + obese,   Peds  Hematology negative hematology ROS (+)   Anesthesia Other Findings   Reproductive/Obstetrics                            Anesthesia Physical Anesthesia Plan  ASA: III  Anesthesia Plan: General   Post-op Pain Management:    Induction: Intravenous  PONV Risk Score and Plan: 4 or greater and Dexamethasone, Ondansetron, Midazolam and Scopolamine patch - Pre-op  Airway Management Planned: Oral ETT  Additional Equipment: None  Intra-op Plan:   Post-operative Plan: Extubation in OR  Informed Consent: I have reviewed the patients History and Physical, chart, labs and discussed the procedure including the risks, benefits and alternatives for the proposed anesthesia with the patient or authorized representative who has indicated his/her understanding and acceptance.   Dental advisory given  Plan Discussed with: CRNA  Anesthesia Plan Comments:        Anesthesia Quick Evaluation

## 2017-08-22 NOTE — Op Note (Signed)
DATE OF PROCEDURE:  08/22/2017                              OPERATIVE REPORT  SURGEON:  Leta Baptist, MD  PREOPERATIVE DIAGNOSES: 1. Adenotonsillar hypertrophy. 2. Chronic tonsillitis and pharyngitis  POSTOPERATIVE DIAGNOSES: 1. Adenotonsillar hypertrophy. 2. Chronic tonsillitis and pharyngitis  PROCEDURE PERFORMED:  Adenotonsillectomy.  ANESTHESIA:  General endotracheal tube anesthesia.  COMPLICATIONS:  None.  ESTIMATED BLOOD LOSS:  Minimal.  INDICATION FOR PROCEDURE:  Kathleen Osborne is a 32 y.o. female with a history of chronic tonsillitis/pharyngitis and halitosis.  According to the patient, she has been experiencing chronic throat discomfort with halitosis for several years. The patient continues to be symptomatic despite medical treatments. On examination, the patient was noted to have bilateral cryptic tonsils, with numerous tonsilloliths. Based on the above findings, the decision was made for the patient to undergo the adenotonsillectomy procedure. Likelihood of success in reducing symptoms was also discussed.  The risks, benefits, alternatives, and details of the procedure were discussed with the patient.  Questions were invited and answered.  Informed consent was obtained.  DESCRIPTION:  The patient was taken to the operating room and placed supine on the operating table.  General endotracheal tube anesthesia was administered by the anesthesiologist.  The patient was positioned and prepped and draped in a standard fashion for adenotonsillectomy.  A Crowe-Davis mouth gag was inserted into the oral cavity for exposure. 3+ cryptic tonsils were noted bilaterally.  No bifidity was noted.  Indirect mirror examination of the nasopharynx revealed moderate adenoid hypertrophy. The adenoid was resected with the Coblator device. Hemostasis was achieved with the Coblator device.  The right tonsil was then grasped with a straight Allis clamp and retracted medially.  It was resected free from the  underlying pharyngeal constrictor muscles with the Coblator device.  The same procedure was repeated on the left side without exception.  The surgical sites were copiously irrigated.  The mouth gag was removed.  The care of the patient was turned over to the anesthesiologist.  The patient was awakened from anesthesia without difficulty.  The patient was extubated and transferred to the recovery room in good condition.  OPERATIVE FINDINGS:  Adenotonsillar hypertrophy.  SPECIMEN:  Bilateral tonsils  FOLLOWUP CARE:  The patient will be discharged home once awake and alert.  She will be placed on amoxicillin 800 mg p.o. b.i.d. for 5 days, and oxycodone 5-40ml po q 4 hours for postop pain control.   The patient will follow up in my office in approximately 2 weeks.  Coleta Grosshans W Giovanie Lefebre 08/22/2017 9:58 AM

## 2017-08-22 NOTE — Anesthesia Procedure Notes (Signed)
Procedure Name: Intubation Performed by: Lindora Alviar M, CRNA Pre-anesthesia Checklist: Patient identified, Emergency Drugs available, Suction available, Patient being monitored and Timeout performed Patient Re-evaluated:Patient Re-evaluated prior to induction Oxygen Delivery Method: Circle system utilized Preoxygenation: Pre-oxygenation with 100% oxygen Induction Type: IV induction Ventilation: Mask ventilation without difficulty Laryngoscope Size: Mac and 3 Grade View: Grade I Tube type: Oral Tube size: 7.0 mm Number of attempts: 1 Airway Equipment and Method: Stylet Placement Confirmation: ETT inserted through vocal cords under direct vision,  positive ETCO2,  CO2 detector and breath sounds checked- equal and bilateral Secured at: 22 cm Tube secured with: Tape Dental Injury: Teeth and Oropharynx as per pre-operative assessment        

## 2017-08-22 NOTE — H&P (Signed)
Cc: Recurrent sore throat  HPI: The patient is a 32 y/o female who presents today with complaint of recurrent sore throat. The patient is seen in consultation requested by [place]. The patient has a history of recurrent tonsillitis at least 3-4 times a year for the past 3 years. The patient is currently on an antibiotic for tonsillitis. She also complains of frequent tonsil stones and halitosis. The patient is otherwise healthy. No previous ENT surgery is noted.   The patient's review of systems (constitutional, eyes, ENT, cardiovascular, respiratory, GI, musculoskeletal, skin, neurologic, psychiatric, endocrine, hematologic, allergic) is noted in the ROS questionnaire.  It is reviewed with the patient.   Family health history: None.  Major events: C-section X 2, wisdom teeth extraction.  Ongoing medical problems: Depression and anxiety.  Social history: The patient is single. She denies the use of tobacco or illegal drugs. She drinks alcohol maybe 1-2 times a month.  Exam: General: Communicates without difficulty, well nourished, no acute distress. Head: Normocephalic, no evidence injury, no tenderness, facial buttresses intact without stepoff. Eyes: PERRL, EOMI.  No scleral icterus, conjunctivae clear. Ears: External auditory canals clear bilaterally.  There is no edema or erythema.  Tympanic membrane is within normal limits bilaterally. Nose: Normal skin and external support.  Anterior rhinoscopy reveals healthy pink mucosa over the septum and turbinates.  No lesions or polyps were seen. Oral cavity: Lips without lesions, oral mucosa moist, no masses or lesions seen. Tonsils 3+ with mild erythema andmultiple tonsil stones noted. Pharynx: Clear, no erythema. Neck: Supple, full range of motion, no lymphadenopathy, no masses palpable. Salivary: Parotid and submandibular glands without mass. Neuro:  CN 2-12 grossly intact. Gait normal. Vestibular: No nystagmus at any point of gaze.    Assessment 1.  The patient's history and physical exam findings are consistent with chronic tonsillitis/pharyngitis, tonsilloliths, and halitosis, secondary to adenotonsillar hypertrophy.  Plan  1. The treatment options include continuing conservative observation versus adenotonsillectomy.  Based on the patient's history and physical exam findings, the patient will likely benefit from having the tonsils and adenoid removed.  The risks, benefits, alternatives, and details of the procedure are reviewed with the patient. Questions are invited and answered.  2. The patient is interested in proceeding with the procedure.  We will schedule the procedure in accordance with the family schedule.

## 2017-08-22 NOTE — Anesthesia Postprocedure Evaluation (Signed)
Anesthesia Post Note  Patient: Kathleen Osborne  Procedure(s) Performed: TONSILLECTOMY AND ADENOIDECTOMY (Bilateral Throat)     Patient location during evaluation: PACU Anesthesia Type: General Level of consciousness: awake and alert Pain management: pain level controlled Vital Signs Assessment: post-procedure vital signs reviewed and stable Respiratory status: spontaneous breathing, nonlabored ventilation, respiratory function stable and patient connected to nasal cannula oxygen Cardiovascular status: blood pressure returned to baseline and stable Postop Assessment: no apparent nausea or vomiting Anesthetic complications: no    Last Vitals:  Vitals:   08/22/17 1100 08/22/17 1113  BP: (!) 141/86   Pulse: 74 70  Resp: 15 15  Temp:    SpO2: 97% 96%    Last Pain:  Vitals:   08/22/17 1113  TempSrc:   PainSc: 2                  Effie Berkshire

## 2017-08-22 NOTE — Transfer of Care (Signed)
Immediate Anesthesia Transfer of Care Note  Patient: Kathleen Osborne  Procedure(s) Performed: TONSILLECTOMY AND ADENOIDECTOMY (Bilateral Throat)  Patient Location: PACU  Anesthesia Type:General  Level of Consciousness: awake, alert  and oriented  Airway & Oxygen Therapy: Patient Spontanous Breathing and Patient connected to face mask oxygen  Post-op Assessment: Report given to RN and Post -op Vital signs reviewed and stable  Post vital signs: Reviewed and stable  Last Vitals:  Vitals Value Taken Time  BP 159/97 08/22/2017  9:53 AM  Temp    Pulse 107 08/22/2017  9:54 AM  Resp    SpO2 90 % 08/22/2017  9:54 AM  Vitals shown include unvalidated device data.  Last Pain:  Vitals:   08/22/17 0805  TempSrc: Oral         Complications: No apparent anesthesia complications

## 2017-08-22 NOTE — Discharge Instructions (Signed)
Charie Pinkus [facility] M[facility] °Postoperative Instructions for Tonsillectomy & Adenoidectomy (T&A) °Activity °Restrict activity at home for the first two days, resting as much as possible. Light indoor activity is best. You may usually return to school or work within a week but void strenuous activity and sports for two weeks. Sleep with your head elevated on 2-3 pillows for 3-4 days to help decrease swelling. °Diet °Due to tissue swelling and throat discomfort, you may have little desire to drink for several days. However fluids are very important to prevent dehydration. You will find that non-acidic juices, soups, popsicles, Jell-O, custard, puddings, and any soft or mashed foods taken in small quantities can be swallowed fairly easily. Try to increase your fluid and food intake as the discomfort subsides. It is recommended that a child receive 1-1/2 quarts of fluid in a 24-hour period. Adult require twice this amount.  °Discomfort °Your sore throat may be relieved by applying an ice collar to your neck and/or by taking Tylenol®. You may experience an earache, which is due to referred pain from the throat. Referred ear pain is commonly felt at night when trying to rest. ° °Bleeding                        Although rare, there is risk of having some bleeding during the first 2 weeks after having a T&A. This usually happens between days 7-10 postoperatively. If you or your child should have any bleeding, try to remain calm. We recommend sitting up quietly in a chair and gently spitting out the blood into a bowl. For adults, gargling gently with ice water may help. If the bleeding does not stop after a short time (5 minutes), is more than 1 teaspoonful, or if you become worried, please call our office at [phone] or go directly to the nearest hospital emergency room. Do not eat or drink anything prior to going to the hospital as you may need to be taken to the operating room in order to control the bleeding. °GENERAL  CONSIDERATIONS °1. Brush your teeth regularly. Avoid mouthwashes and gargles for three weeks. You may gargle gently with warm salt-water as necessary or spray with Chloraseptic®. You may make salt-water by placing 2 teaspoons of table salt into a quart of fresh water. Warm the salt-water in a microwave to a luke warm temperature.  °2. Avoid exposure to colds and upper respiratory infections if possible.  °3. If you look into a mirror or into your child's mouth, you will see white-gray patches in the back of the throat. This is normal after having a T&A and is like a scab that forms on the skin after an abrasion. It will disappear once the back of the throat heals completely. However, it may cause a noticeable odor; this too will disappear with time. Again, warm salt-water gargles may be used to help keep the throat clean and promote healing.  °4. You may notice a temporary change in voice quality, such as a higher pitched voice or a nasal sound, until healing is complete. This may last for 1-2 weeks and should resolve.  °5. Do not take or give you child any medications that we have not prescribed or recommended.  °6. Snoring may occur, especially at night, for the first week after a T&A. It is due to swelling of the soft palate and will usually resolve.  °Please call our office at [phone] if you have any questions.   °

## 2017-08-23 ENCOUNTER — Encounter (HOSPITAL_BASED_OUTPATIENT_CLINIC_OR_DEPARTMENT_OTHER): Payer: Self-pay | Admitting: Otolaryngology

## 2017-08-31 ENCOUNTER — Encounter (HOSPITAL_COMMUNITY): Payer: Self-pay | Admitting: Emergency Medicine

## 2017-08-31 ENCOUNTER — Other Ambulatory Visit: Payer: Self-pay

## 2017-08-31 ENCOUNTER — Emergency Department (HOSPITAL_COMMUNITY)
Admission: EM | Admit: 2017-08-31 | Discharge: 2017-08-31 | Disposition: A | Discharge status: Home / Self Care | Payer: 59 | Attending: Emergency Medicine | Admitting: Emergency Medicine

## 2017-08-31 ENCOUNTER — Emergency Department (HOSPITAL_COMMUNITY)
Admission: EM | Admit: 2017-08-31 | Discharge: 2017-08-31 | Disposition: A | Discharge status: Home / Self Care | Payer: 59 | Source: Home / Self Care | Attending: Emergency Medicine | Admitting: Emergency Medicine

## 2017-08-31 DIAGNOSIS — R112 Nausea with vomiting, unspecified: Secondary | ICD-10-CM | POA: Insufficient documentation

## 2017-08-31 DIAGNOSIS — Z79899 Other long term (current) drug therapy: Secondary | ICD-10-CM | POA: Insufficient documentation

## 2017-08-31 DIAGNOSIS — Z87891 Personal history of nicotine dependence: Secondary | ICD-10-CM

## 2017-08-31 DIAGNOSIS — J95830 Postprocedural hemorrhage and hematoma of a respiratory system organ or structure following a respiratory system procedure: Secondary | ICD-10-CM | POA: Diagnosis not present

## 2017-08-31 LAB — I-STAT CHEM 8, ED
BUN: 11 mg/dL (ref 6–20)
BUN: 16 mg/dL (ref 6–20)
CALCIUM ION: 1.14 mmol/L — AB (ref 1.15–1.40)
CHLORIDE: 107 mmol/L (ref 101–111)
Calcium, Ion: 1.08 mmol/L — ABNORMAL LOW (ref 1.15–1.40)
Chloride: 112 mmol/L — ABNORMAL HIGH (ref 101–111)
Creatinine, Ser: 0.6 mg/dL (ref 0.44–1.00)
Creatinine, Ser: 0.4 mg/dL — ABNORMAL LOW (ref 0.44–1.00)
Glucose, Bld: 111 mg/dL — ABNORMAL HIGH (ref 65–99)
Glucose, Bld: 99 mg/dL (ref 65–99)
HCT: 28 % — ABNORMAL LOW (ref 36.0–46.0)
HEMATOCRIT: 32 % — AB (ref 36.0–46.0)
Hemoglobin: 10.9 g/dL — ABNORMAL LOW (ref 12.0–15.0)
Hemoglobin: 9.5 g/dL — ABNORMAL LOW (ref 12.0–15.0)
Potassium: 3.6 mmol/L (ref 3.5–5.1)
Potassium: 3.8 mmol/L (ref 3.5–5.1)
Sodium: 142 mmol/L (ref 135–145)
Sodium: 143 mmol/L (ref 135–145)
TCO2: 21 mmol/L — AB (ref 22–32)
TCO2: 21 mmol/L — ABNORMAL LOW (ref 22–32)

## 2017-08-31 LAB — CBC WITH DIFFERENTIAL/PLATELET
ABS IMMATURE GRANULOCYTES: 0 10*3/uL (ref 0.0–0.1)
BASOS ABS: 0 10*3/uL (ref 0.0–0.1)
BASOS PCT: 0 %
Eosinophils Absolute: 0.1 10*3/uL (ref 0.0–0.7)
Eosinophils Relative: 1 %
HCT: 35.3 % — ABNORMAL LOW (ref 36.0–46.0)
HEMOGLOBIN: 10.7 g/dL — AB (ref 12.0–15.0)
Immature Granulocytes: 0 %
LYMPHS PCT: 21 %
Lymphs Abs: 2.3 10*3/uL (ref 0.7–4.0)
MCH: 25.5 pg — ABNORMAL LOW (ref 26.0–34.0)
MCHC: 30.3 g/dL (ref 30.0–36.0)
MCV: 84 fL (ref 78.0–100.0)
Monocytes Absolute: 0.6 10*3/uL (ref 0.1–1.0)
Monocytes Relative: 6 %
NEUTROS ABS: 8.1 10*3/uL — AB (ref 1.7–7.7)
Neutrophils Relative %: 72 %
PLATELETS: 330 10*3/uL (ref 150–400)
RBC: 4.2 MIL/uL (ref 3.87–5.11)
RDW: 13.9 % (ref 11.5–15.5)
WBC: 11.2 10*3/uL — AB (ref 4.0–10.5)

## 2017-08-31 MED ORDER — ONDANSETRON 4 MG PO TBDP
4.0000 mg | ORAL_TABLET | Freq: Once | ORAL | Status: AC
  Administered 2017-08-31: 4 mg via ORAL
  Filled 2017-08-31: qty 1

## 2017-08-31 MED ORDER — ONDANSETRON HCL 4 MG/2ML IJ SOLN
4.0000 mg | Freq: Once | INTRAMUSCULAR | Status: AC
  Administered 2017-08-31: 4 mg via INTRAVENOUS
  Filled 2017-08-31: qty 2

## 2017-08-31 MED ORDER — SODIUM CHLORIDE 0.9 % IV BOLUS
500.0000 mL | Freq: Once | INTRAVENOUS | Status: AC
  Administered 2017-08-31: 500 mL via INTRAVENOUS

## 2017-08-31 MED ORDER — SODIUM CHLORIDE 0.9 % IV BOLUS
1000.0000 mL | Freq: Once | INTRAVENOUS | Status: AC
  Administered 2017-08-31: 1000 mL via INTRAVENOUS

## 2017-08-31 MED ORDER — ONDANSETRON HCL 4 MG PO TABS: 4.0000 mg | ORAL_TABLET | Freq: Four times a day (QID) | ORAL | 0 refills | Status: DC

## 2017-08-31 NOTE — ED Triage Notes (Signed)
Patient to ED c/o N/V since yesterday. Patient was seen here last night for hemorrhage s/p tonsillectomy last week. Patient states the bleeding stopped and she was discharged. Patient tearful in triage, states she feels dehydrated, HR 130s, but she denies fevers/chills. Airway intact, resp e/u. Denies abd pain or diarrhea.

## 2017-08-31 NOTE — ED Triage Notes (Signed)
Patient c/o bleeding, vomiting/spitting up blood clots that started tonight. Tonsillectomy was on 08/22/2017.

## 2017-08-31 NOTE — ED Provider Notes (Signed)
[facility] EMERGENCY DEPARTMENT Provider Note   CSN: [id number] Arrival date & time: 08/31/17  1217     History   Chief Complaint Chief Complaint  Patient presents with  . Emesis    HPI Kathleen Osborne is a 32 y.o. female.  HPI   32 year old female presents today with complaints of nausea and vomiting.  Patient reports she had tonsillectomy performed on 08/22/2017 by Dr. Lorelee Cover.  Patient notes last night she had acute onset bleeding, she was seen in the emergency room.  Bleeding had stopped, labs were drawn with no significant abnormalities.  She was discharged home.  She notes she continued to have a small amount of bleeding which caused nausea and vomiting.  She notes spitting up blood clots.  She denies any abdominal pain or fever, she denies any difficulty swallowing or breathing, no significant neck or throat pain.   Past Medical History:  Diagnosis Date  . Anxiety    no meds while pregnant  . Depression   . Family history of breast cancer   . Family history of melanoma   . Heartburn in pregnancy   . Monoallelic mutation of CHEK2 gene     Patient Active Problem List   Diagnosis Date Noted  . Encounter for removal of intrauterine contraceptive device (IUD) 06/28/2016  . Monoallelic mutation of CHEK2 gene   . Genetic testing 09/05/2014  . Family history of breast cancer   . Family history of melanoma   . Obesity 08/01/2014  . H/O: C-section 10/19/2012    Past Surgical History:  Procedure Laterality Date  . CERVICAL CONE BIOPSY     2006  . CESAREAN SECTION     NRFHR 1st Csection.  Marland Kitchen CESAREAN SECTION N/A 10/24/2012   Procedure: REPEAT CESAREAN SECTION;  Surgeon: Shelly Bombard, MD;  Location: [facility];  Service: Obstetrics;  Laterality: N/A;  . LEEP    . TONSILLECTOMY AND ADENOIDECTOMY Bilateral 08/22/2017   Procedure: TONSILLECTOMY AND ADENOIDECTOMY;  Surgeon: Leta Baptist, MD;  Location: [facility];  Service: ENT;  Laterality:  Bilateral;     OB History    Gravida  2   Para  2   Term  2   Preterm      AB      Living  2     SAB      TAB      Ectopic      Multiple      Live Births  2            Home Medications    Prior to Admission medications   Medication Sig Start Date End Date Taking? Authorizing Provider  ibuprofen (ADVIL,MOTRIN) 200 MG tablet Take 200 mg by mouth every 6 (six) hours as needed.    [provider]  ISIBLOOM 0.15-30 MG-MCG tablet  06/07/17   [provider]  ondansetron (ZOFRAN) 4 MG tablet Take 1 tablet (4 mg total) by mouth every 6 (six) hours. 08/31/17   Blessing Zaucha, Dellis Filbert, PA-C  oxyCODONE (ROXICODONE) 5 MG/5ML solution Take 5-10 mLs (5-10 mg total) by mouth every 4 (four) hours as needed for severe pain. 08/22/17   Leta Baptist, MD  sertraline (ZOLOFT) 50 MG tablet take 1 and 1/2 tablets by mouth once daily 08/09/16   Susy Frizzle, MD    Family History Family History  Problem Relation Age of Onset  . Melanoma Mother 78  . Early death Mother   . Hyperlipidemia Father   .  Breast cancer Maternal Grandmother        dx in her 64s  . Parkinson's disease Maternal Grandfather   . Depression Maternal Grandfather   . Drug abuse Maternal Grandfather   . Mental illness Maternal Grandfather   . Prostate cancer Maternal Grandfather   . Colon cancer Maternal Grandfather   . Breast cancer Maternal Aunt 33  . Prostate cancer Paternal Grandfather   . Melanoma Sister 59  . Mental illness Maternal Aunt     Social History Social History   Tobacco Use  . Smoking status: Former Smoker    Types: E-cigarettes    Last attempt to quit: 03/14/2010    Years since quitting: 7.4  . Smokeless tobacco: Never Used  Substance Use Topics  . Alcohol use: Yes    Alcohol/week: 0.0 oz    Comment: Occassionally  . Drug use: No     Allergies   Patient has no known allergies.   Review of Systems Review of Systems  All other systems reviewed and are  negative.    Physical Exam Updated Vital Signs BP (!) 134/93   Pulse (!) 104   Temp 98.8 F (37.1 C) (Oral)   Resp 18   LMP 08/08/2017 (Exact Date)   SpO2 100%   Physical Exam  Constitutional: She is oriented to person, place, and time. She appears well-developed and well-nourished.  HENT:  Head: Normocephalic and atraumatic.  Posterior oropharynx with gray-yellowish adherent scar tissue, no surrounding erythema or signs of infection, no pooling of secretions, no bleeding  Eyes: Pupils are equal, round, and reactive to light. Conjunctivae are normal. Right eye exhibits no discharge. Left eye exhibits no discharge. No scleral icterus.  Neck: Normal range of motion. Neck supple. No JVD present. No tracheal deviation present.  Pulmonary/Chest: Effort normal. No stridor.  Abdominal: Soft. She exhibits no distension and no mass. There is no tenderness. There is no rebound and no guarding. No hernia.  Lymphadenopathy:    She has no cervical adenopathy.  Neurological: She is alert and oriented to person, place, and time. Coordination normal.  Psychiatric: She has a normal mood and affect. Her behavior is normal. Judgment and thought content normal.  Nursing note and vitals reviewed.    ED Treatments / Results  Labs (all labs ordered are listed, but only abnormal results are displayed) Labs Reviewed  I-STAT CHEM 8, ED - Abnormal; Notable for the following components:      Result Value   Chloride 112 (*)    Creatinine, Ser 0.40 (*)    Calcium, Ion 1.08 (*)    TCO2 21 (*)    Hemoglobin 9.5 (*)    HCT 28.0 (*)    All other components within normal limits    EKG None  Radiology No results found.  Procedures Procedures (including critical care time)  Medications Ordered in ED Medications  ondansetron (ZOFRAN-ODT) disintegrating tablet 4 mg (4 mg Oral Given 08/31/17 1249)  sodium chloride 0.9 % bolus 1,000 mL (0 mLs Intravenous Stopped 08/31/17 1545)  sodium chloride 0.9 %  bolus 500 mL (0 mLs Intravenous Stopped 08/31/17 1545)     Initial Impression / Assessment and Plan / ED Course  I have reviewed the triage vital signs and the nursing notes.  Pertinent labs & imaging results that were available during my care of the patient were reviewed by me and considered in my medical decision making (see chart for details).     Labs: Chem-8  Imaging:  Consults:  Therapeutics: Normal saline, Zofran  Discharge Meds:   Assessment/Plan: 32 year old female presents today with vomiting.  Patient likely having gastric irritation secondary to bleed.  Patient is no longer bleeding but she has no nausea or vomiting while here, her abdomen is soft nontender with no signs of infection.  Patient slightly tachycardic, she will be given fluids here and reassessed anticipate outpatient follow-up with ENT.  Patient given fluids, tolerating p.o.  She has dramatic improvement in her symptoms with no significant complaints.  She has no bleeding, no signs of infection.  She is still slightly tachycardic, likely secondary to recent vomiting.  Patient will continue hydration at home, outpatient follow-up with ENT strict return precautions given.  Verbalized understanding and agreement to today's plan.   Final Clinical Impressions(s) / ED Diagnoses   Final diagnoses:  Non-intractable vomiting with nausea, unspecified vomiting type    ED Discharge Orders        Ordered    ondansetron (ZOFRAN) 4 MG tablet  Every 6 hours     08/31/17 1553       Okey Regal, PA-C 08/31/17 1556    Fredia Sorrow, MD 09/01/17 1417

## 2017-08-31 NOTE — ED Notes (Signed)
ED Provider at bedside. 

## 2017-08-31 NOTE — Discharge Instructions (Signed)
Can continue to gargle with cold water.  Avoid forceful coughing.  Return if bleeding becomes brisk and you are unable to get a hold of Dr. Benjamine Mola

## 2017-08-31 NOTE — ED Provider Notes (Signed)
[facility] Provider Note   CSN: [id number] Arrival date & time: 08/31/17  0302     History   Chief Complaint Chief Complaint  Patient presents with  . Bleeding/Bruising    Post-op tonsillectomy    HPI Kathleen Osborne is a 32 y.o. female.  Patient is a 32 year old female with a history of recent tonsillectomy on 08/22/2017 presenting today with bleeding.  Patient states she had been doing well but had a forceful cough today and started spitting up blood.  This continued from 9 PM until now.  She intermittently will cough up blood and then vomit a large amount of blood.  She is feeling nauseated and continues to feel the sensation of bleeding.  She is not having any significant pain and denies any lightheadedness or near syncope.  She spoke with Dr. Benjamine Mola who recommended doing cold water gargles but states symptoms have not improved.  The history is provided by the patient.    Past Medical History:  Diagnosis Date  . Anxiety    no meds while pregnant  . Depression   . Family history of breast cancer   . Family history of melanoma   . Heartburn in pregnancy   . Monoallelic mutation of CHEK2 gene     Patient Active Problem List   Diagnosis Date Noted  . Encounter for removal of intrauterine contraceptive device (IUD) 06/28/2016  . Monoallelic mutation of CHEK2 gene   . Genetic testing 09/05/2014  . Family history of breast cancer   . Family history of melanoma   . Obesity 08/01/2014  . H/O: C-section 10/19/2012    Past Surgical History:  Procedure Laterality Date  . CERVICAL CONE BIOPSY     2006  . CESAREAN SECTION     NRFHR 1st Csection.  Marland Kitchen CESAREAN SECTION N/A 10/24/2012   Procedure: REPEAT CESAREAN SECTION;  Surgeon: Shelly Bombard, MD;  Location: [facility];  Service: Obstetrics;  Laterality: N/A;  . LEEP    . TONSILLECTOMY AND ADENOIDECTOMY Bilateral 08/22/2017   Procedure: TONSILLECTOMY AND ADENOIDECTOMY;  Surgeon: Leta Baptist,  MD;  Location: [facility];  Service: ENT;  Laterality: Bilateral;     OB History    Gravida  2   Para  2   Term  2   Preterm      AB      Living  2     SAB      TAB      Ectopic      Multiple      Live Births  2            Home Medications    Prior to Admission medications   Medication Sig Start Date End Date Taking? Authorizing Provider  ibuprofen (ADVIL,MOTRIN) 200 MG tablet Take 200 mg by mouth every 6 (six) hours as needed.    [provider]  ISIBLOOM 0.15-30 MG-MCG tablet  06/07/17   [provider]  oxyCODONE (ROXICODONE) 5 MG/5ML solution Take 5-10 mLs (5-10 mg total) by mouth every 4 (four) hours as needed for severe pain. 08/22/17   Leta Baptist, MD  sertraline (ZOLOFT) 50 MG tablet take 1 and 1/2 tablets by mouth once daily 08/09/16   Susy Frizzle, MD    Family History Family History  Problem Relation Age of Onset  . Melanoma Mother 5  . Early death Mother   . Hyperlipidemia Father   . Breast cancer Maternal Grandmother  dx in her 46s  . Parkinson's disease Maternal Grandfather   . Depression Maternal Grandfather   . Drug abuse Maternal Grandfather   . Mental illness Maternal Grandfather   . Prostate cancer Maternal Grandfather   . Colon cancer Maternal Grandfather   . Breast cancer Maternal Aunt 33  . Prostate cancer Paternal Grandfather   . Melanoma Sister 8  . Mental illness Maternal Aunt     Social History Social History   Tobacco Use  . Smoking status: Former Smoker    Types: E-cigarettes    Last attempt to quit: 03/14/2010    Years since quitting: 7.4  . Smokeless tobacco: Never Used  Substance Use Topics  . Alcohol use: Yes    Alcohol/week: 0.0 oz    Comment: Occassionally  . Drug use: No     Allergies   Patient has no known allergies.   Review of Systems Review of Systems  All other systems reviewed and are negative.    Physical Exam Updated Vital Signs BP 127/75    Pulse 82   Temp 98 F (36.7 C) (Oral)   Resp 18   Ht '5\' 1"'  (1.549 m)   Wt 108.9 kg (240 lb)   LMP 08/08/2017 (Exact Date)   SpO2 98%   BMI 45.35 kg/m   Physical Exam  Constitutional: She is oriented to person, place, and time. She appears well-developed and well-nourished. No distress.  HENT:  Head: Normocephalic and atraumatic.  Mouth/Throat: Oropharynx is clear and moist.    Eyes: Pupils are equal, round, and reactive to light. Conjunctivae and EOM are normal.  Neck: Normal range of motion. Neck supple.  Cardiovascular: Normal rate, regular rhythm and intact distal pulses.  No murmur heard. Pulmonary/Chest: Effort normal and breath sounds normal. No respiratory distress. She has no wheezes. She has no rales.  Musculoskeletal: Normal range of motion. She exhibits no edema or tenderness.  Neurological: She is alert and oriented to person, place, and time.  Skin: Skin is warm and dry. No rash noted. No erythema.  Psychiatric: She has a normal mood and affect. Her behavior is normal.  Nursing note and vitals reviewed.    ED Treatments / Results  Labs (all labs ordered are listed, but only abnormal results are displayed) Labs Reviewed  CBC WITH DIFFERENTIAL/PLATELET - Abnormal; Notable for the following components:      Result Value   WBC 11.2 (*)    Hemoglobin 10.7 (*)    HCT 35.3 (*)    MCH 25.5 (*)    Neutro Abs 8.1 (*)    All other components within normal limits  I-STAT CHEM 8, ED - Abnormal; Notable for the following components:   Glucose, Bld 111 (*)    Calcium, Ion 1.14 (*)    TCO2 21 (*)    Hemoglobin 10.9 (*)    HCT 32.0 (*)    All other components within normal limits    EKG None  Radiology No results found.  Procedures Procedures (including critical care time)  Medications Ordered in ED Medications  ondansetron (ZOFRAN) injection 4 mg (4 mg Intravenous Given 08/31/17 0427)     Initial Impression / Assessment and Plan / ED Course  I have  reviewed the triage vital signs and the nursing notes.  Pertinent labs & imaging results that were available during my care of the patient were reviewed by me and considered in my medical decision making (see chart for details).     Patient presenting postop day  9 from tonsillectomy with bleeding.  Patient has a clot present on the left tonsil but no active bleeding is identified.  She gargled several times with cold water with some clot removed but no recurrent active bleeding at this time.  Hemoglobin is reassuring at 10.7.  Vital signs are within normal limits.  Patient does not take anticoagulation.  Will allow patient to go home and recommended follow-up with Dr. Benjamine Mola is sx recur.  Final Clinical Impressions(s) / ED Diagnoses   Final diagnoses:  Post tonsillectomy secondary hemorrhage    ED Discharge Orders    None       Blanchie Dessert, MD 08/31/17 [phone]

## 2017-08-31 NOTE — Discharge Instructions (Signed)
Please read attached information. If you experience any new or worsening signs or symptoms please return to the emergency room for evaluation. Please follow-up with your primary care provider or specialist as discussed. Please use medication prescribed only as directed and discontinue taking if you have any concerning signs or symptoms.   °

## 2017-09-07 ENCOUNTER — Other Ambulatory Visit: Payer: Self-pay | Admitting: Family Medicine

## 2017-09-07 DIAGNOSIS — F329 Major depressive disorder, single episode, unspecified: Secondary | ICD-10-CM

## 2017-09-07 DIAGNOSIS — F32A Depression, unspecified: Secondary | ICD-10-CM

## 2017-09-30 DIAGNOSIS — H10021 Other mucopurulent conjunctivitis, right eye: Secondary | ICD-10-CM | POA: Diagnosis not present

## 2017-10-14 ENCOUNTER — Other Ambulatory Visit: Payer: Self-pay | Admitting: Family Medicine

## 2017-10-14 DIAGNOSIS — F32A Depression, unspecified: Secondary | ICD-10-CM

## 2017-10-14 DIAGNOSIS — F329 Major depressive disorder, single episode, unspecified: Secondary | ICD-10-CM

## 2017-11-26 ENCOUNTER — Other Ambulatory Visit: Payer: Self-pay | Admitting: Family Medicine

## 2017-11-26 DIAGNOSIS — F32A Depression, unspecified: Secondary | ICD-10-CM

## 2017-11-26 DIAGNOSIS — F329 Major depressive disorder, single episode, unspecified: Secondary | ICD-10-CM

## 2018-01-02 DIAGNOSIS — J1189 Influenza due to unidentified influenza virus with other manifestations: Secondary | ICD-10-CM | POA: Diagnosis not present

## 2018-02-15 ENCOUNTER — Ambulatory Visit (INDEPENDENT_AMBULATORY_CARE_PROVIDER_SITE_OTHER): Payer: 59

## 2018-02-15 DIAGNOSIS — N921 Excessive and frequent menstruation with irregular cycle: Secondary | ICD-10-CM | POA: Diagnosis not present

## 2018-02-15 DIAGNOSIS — N926 Irregular menstruation, unspecified: Secondary | ICD-10-CM

## 2018-02-15 LAB — POCT URINE PREGNANCY: PREG TEST UR: NEGATIVE

## 2018-02-15 NOTE — Progress Notes (Signed)
Ms. Pellegrin presents today for UPT. She takes BCP continuously but stopped them 2 wks ago after initial episode of heavy vaginal bleeding and changing both pads/tampons every hour. The next day bleeding subsided and she took a UPT. UPT was faint positive so she stopped her birth control immediately. Informed patient since she's been taking BCP's continuously for over a year, her body is getting used to the hormones; therefore, she may experience breakthrough bleeding. It is recommended to take it 3 months straight then have a period. She should allow her body to have menstrual cycles at least 3-4 times a year. Pt agrees to start her BCP when she starts her next cycle.  During conversation she mentioned approximately 3 wks ago, she experienced N&V so she took a UPT and it was faint positive and it turned out she had the flu. UPT neg today.    LMP: 2 wks ago, she's unsure exact date.    OBJECTIVE: Appears well, in no apparent distress.  OB History    Gravida  2   Para  2   Term  2   Preterm      AB      Living  2     SAB      TAB      Ectopic      Multiple      Live Births  2          Home UPT Result: Faint positive  In-Office UPT result: Negative  I have reviewed the patient's medical, obstetrical, social, and family histories, and medications.   ASSESSMENT: Negative pregnancy test  PLAN: Pt to schedule annual ASAP

## 2018-02-16 NOTE — Progress Notes (Signed)
I have reviewed the chart and agree with nursing staff's documentation of this patient's encounter.  [name], MD 02/16/2018 8:11 AM

## 2018-02-28 ENCOUNTER — Other Ambulatory Visit: Payer: Self-pay | Admitting: Family Medicine

## 2018-02-28 DIAGNOSIS — F329 Major depressive disorder, single episode, unspecified: Secondary | ICD-10-CM

## 2018-02-28 DIAGNOSIS — F32A Depression, unspecified: Secondary | ICD-10-CM

## 2018-03-26 ENCOUNTER — Ambulatory Visit: Payer: 59 | Admitting: Advanced Practice Midwife

## 2018-04-03 ENCOUNTER — Ambulatory Visit: Payer: 59 | Admitting: Advanced Practice Midwife

## 2018-04-03 NOTE — Progress Notes (Deleted)
Subjective:     Kathleen Osborne is a 33 y.o. female here for a routine exam.  Current complaints: ***.  Personal health questionnaire reviewed: {yes/no:9010}.   Gynecologic History No LMP recorded. (Menstrual status: Oral contraceptives). Contraception: {method:5051} Last Pap: 08/01/14. Results were: normal   Obstetric History OB History  Gravida Para Term Preterm AB Living  2 2 2     2   SAB TAB Ectopic Multiple Live Births          2    # Outcome Date GA Lbr Len/2nd Weight Sex Delivery Anes PTL Lv  2 Term 10/24/12 [redacted]w[redacted]d  3245 g F CS-Vac Spinal  LIV  1 Term 12/19/10 [redacted]w[redacted]d  4054 g M CS-LTranv   LIV     Birth Comments: No complications.  Failed induction     {Common ambulatory SmartLinks:19316}  Review of Systems {ros; complete:30496}    Objective:    {exam; complete:18323}    Assessment:    Healthy female exam.    Plan:    {plan:19193}

## 2018-08-27 ENCOUNTER — Other Ambulatory Visit: Payer: Self-pay | Admitting: Family Medicine

## 2018-08-27 DIAGNOSIS — F329 Major depressive disorder, single episode, unspecified: Secondary | ICD-10-CM

## 2018-08-27 DIAGNOSIS — F32A Depression, unspecified: Secondary | ICD-10-CM

## 2018-11-29 ENCOUNTER — Other Ambulatory Visit: Payer: Self-pay | Admitting: Family Medicine

## 2018-11-29 DIAGNOSIS — F32A Depression, unspecified: Secondary | ICD-10-CM

## 2018-11-29 DIAGNOSIS — F329 Major depressive disorder, single episode, unspecified: Secondary | ICD-10-CM

## 2018-12-24 ENCOUNTER — Ambulatory Visit (INDEPENDENT_AMBULATORY_CARE_PROVIDER_SITE_OTHER): Payer: 59 | Admitting: Obstetrics and Gynecology

## 2018-12-24 ENCOUNTER — Other Ambulatory Visit: Payer: Self-pay

## 2018-12-24 ENCOUNTER — Encounter: Payer: Self-pay | Admitting: Obstetrics and Gynecology

## 2018-12-24 ENCOUNTER — Other Ambulatory Visit (HOSPITAL_COMMUNITY)
Admission: RE | Admit: 2018-12-24 | Discharge: 2018-12-24 | Disposition: A | Discharge status: Home / Self Care | Payer: 59 | Source: Ambulatory Visit | Attending: Obstetrics and Gynecology | Admitting: Obstetrics and Gynecology

## 2018-12-24 VITALS — BP 134/84 | HR 102 | Ht 61.0 in | Wt 260.0 lb

## 2018-12-24 DIAGNOSIS — B373 Candidiasis of vulva and vagina: Secondary | ICD-10-CM | POA: Diagnosis not present

## 2018-12-24 DIAGNOSIS — B9689 Other specified bacterial agents as the cause of diseases classified elsewhere: Secondary | ICD-10-CM | POA: Diagnosis not present

## 2018-12-24 DIAGNOSIS — N939 Abnormal uterine and vaginal bleeding, unspecified: Secondary | ICD-10-CM

## 2018-12-24 DIAGNOSIS — N898 Other specified noninflammatory disorders of vagina: Secondary | ICD-10-CM

## 2018-12-24 DIAGNOSIS — Z01419 Encounter for gynecological examination (general) (routine) without abnormal findings: Secondary | ICD-10-CM | POA: Diagnosis present

## 2018-12-24 DIAGNOSIS — Z3202 Encounter for pregnancy test, result negative: Secondary | ICD-10-CM

## 2018-12-24 DIAGNOSIS — N760 Acute vaginitis: Secondary | ICD-10-CM

## 2018-12-24 MED ORDER — MEGESTROL ACETATE 40 MG PO TABS: 40.0000 mg | ORAL_TABLET | Freq: Every day | ORAL | 1 refills | Status: DC

## 2018-12-24 NOTE — Progress Notes (Signed)
Subjective:     Kathleen Osborne is a 33 y.o. female P2 with BMI 49 here for for a comprehensive physical exam. The patient reports abnormal uterine bleeding. She reports experiencing a prolonged period lasting 43 days. Patient reports a history of irregular period lately. She reports facial hair on her cheeks and chin that she actually shaves regularly. She is sexually active without contraception. She denies any pelvic pain or abnormal discharge. Patient reports a normal pap smear 02/2018   Past Medical History:  Diagnosis Date  . Anxiety    no meds while pregnant  . Depression   . Family history of breast cancer   . Family history of melanoma   . Heartburn in pregnancy   . Monoallelic mutation of CHEK2 gene    Past Surgical History:  Procedure Laterality Date  . CERVICAL CONE BIOPSY     2006  . CESAREAN SECTION     NRFHR 1st Csection.  Marland Kitchen CESAREAN SECTION N/A 10/24/2012   Procedure: REPEAT CESAREAN SECTION;  Surgeon: Shelly Bombard, MD;  Location: [facility];  Service: Obstetrics;  Laterality: N/A;  . LEEP    . TONSILLECTOMY AND ADENOIDECTOMY Bilateral 08/22/2017   Procedure: TONSILLECTOMY AND ADENOIDECTOMY;  Surgeon: Leta Baptist, MD;  Location: [facility];  Service: ENT;  Laterality: Bilateral;   Family History  Problem Relation Age of Onset  . Melanoma Mother 107  . Early death Mother   . Hyperlipidemia Father   . Breast cancer Maternal Grandmother        dx in her 57s  . Parkinson's disease Maternal Grandfather   . Depression Maternal Grandfather   . Drug abuse Maternal Grandfather   . Mental illness Maternal Grandfather   . Prostate cancer Maternal Grandfather   . Colon cancer Maternal Grandfather   . Breast cancer Maternal Aunt 33  . Prostate cancer Paternal Grandfather   . Melanoma Sister 66  . Mental illness Maternal Aunt     Social History   Socioeconomic History  . Marital status: Married    Spouse name: Not on file  . Number of children: 2  .  Years of education: Not on file  . Highest education level: Not on file  Occupational History  . Not on file  Social Needs  . Financial resource strain: Not on file  . Food insecurity    Worry: Not on file    Inability: Not on file  . Transportation needs    Medical: Not on file    Non-medical: Not on file  Tobacco Use  . Smoking status: Former Smoker    Types: E-cigarettes    Quit date: 03/14/2010    Years since quitting: 8.7  . Smokeless tobacco: Never Used  Substance and Sexual Activity  . Alcohol use: Yes    Alcohol/week: 0.0 standard drinks    Comment: Occassionally  . Drug use: No  . Sexual activity: Yes    Partners: Male    Birth control/protection: Pill  Lifestyle  . Physical activity    Days per week: Not on file    Minutes per session: Not on file  . Stress: Not on file  Relationships  . Social Herbalist on phone: Not on file    Gets together: Not on file    Attends religious service: Not on file    Active member of club or organization: Not on file    Attends meetings of clubs or organizations: Not on file  Relationship status: Not on file  . Intimate partner violence    Fear of current or ex partner: Not on file    Emotionally abused: Not on file    Physically abused: Not on file    Forced sexual activity: Not on file  Other Topics Concern  . Not on file  Social History Narrative  . Not on file   Health Maintenance  Topic Date Due  . PAP SMEAR-Modifier  07/31/2017  . INFLUENZA VACCINE  10/13/2018  . TETANUS/TDAP  03/14/2020  . HIV Screening  Completed       Review of Systems Pertinent items are noted in HPI.   Objective:  Blood pressure 134/84, pulse (!) 102, height _0  (1.549 m), weight 260 lb (117.9 kg), last menstrual period 11/17/2018.     GENERAL: Well-developed, well-nourished female in no acute distress.  HEENT: Normocephalic, atraumatic. Sclerae anicteric.  NECK: Supple. Normal thyroid.  LUNGS: Clear to auscultation  bilaterally.  HEART: Regular rate and rhythm. BREASTS: Symmetric in size. No palpable masses or lymphadenopathy, skin changes, or nipple drainage. ABDOMEN: Soft, nontender, nondistended. No organomegaly. PELVIC: Normal external female genitalia. Vagina is pink and rugated.  Normal discharge. Normal appearing cervix. Uterus is normal in size. No adnexal mass or tenderness. EXTREMITIES: No cyanosis, clubbing, or edema, 2+ distal pulses.    Assessment:    Healthy female exam.      Plan:     Endometrial biopsy ENDOMETRIAL BIOPSY     The indications for endometrial biopsy were reviewed.   Risks of the biopsy including cramping, bleeding, infection, uterine perforation, inadequate specimen and need for additional procedures  were discussed. The patient states she understands and agrees to undergo procedure today. Consent was signed. Time out was performed. Urine HCG was negative. A sterile speculum was placed in the patient's vagina and the cervix was prepped with Betadine. A single-toothed tenaculum was placed on the anterior lip of the cervix to stabilize it. The uterine cavity was sounded to a depth of 8 cm using the uterine sound. The 3 mm pipelle was introduced into the endometrial cavity without difficulty, 2 passes were made.  A  moderate amount of tissue was  sent to pathology. The instruments were removed from the patient's vagina. Minimal bleeding from the cervix was noted. The patient tolerated the procedure well.  Routine post-procedure instructions were given to the patient. The patient will follow up in two weeks to review the results and for further management.   Discussed medical management with contraception. Patient is interested in surgical options. Surgical options will be discussed pending results of endometrial biopsy and pelvic ultrasound Pelvic ultrasound ordered Patient will be contacted with abnormal results Rx Megace provided  See After Visit Summary for Counseling  Recommendations

## 2018-12-24 NOTE — Progress Notes (Signed)
Patient presents for problem visit today,   CC: prolonged heavy bleeding x 43 days now soaks ultra tampon and pad in 1 hr   notes fatigue  and weight gain Pt also notes facial hair growth.  Pt wants to thyroid labs.   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    Contraception: None. Wants to discuss surgery. STD Screening: Desires Last pap: [facility]   UPT : Negative

## 2018-12-25 LAB — POCT URINE PREGNANCY: Preg Test, Ur: NEGATIVE

## 2018-12-25 LAB — CERVICOVAGINAL ANCILLARY ONLY
Bacterial Vaginitis (gardnerella): POSITIVE — AB
Candida Glabrata: NEGATIVE
Candida Vaginitis: POSITIVE — AB
Comment: NEGATIVE
Comment: NEGATIVE
Comment: NEGATIVE

## 2018-12-25 NOTE — Addendum Note (Signed)
Addended by: Courtney Heys on: 12/25/2018 10:15 AM   Modules accepted: Orders

## 2018-12-26 LAB — CBC
Hematocrit: 25.3 % — ABNORMAL LOW (ref 34.0–46.6)
Hemoglobin: 7.8 g/dL — ABNORMAL LOW (ref 11.1–15.9)
MCH: 25.2 pg — ABNORMAL LOW (ref 26.6–33.0)
MCHC: 30.8 g/dL — ABNORMAL LOW (ref 31.5–35.7)
MCV: 82 fL (ref 79–97)
Platelets: 398 10*3/uL (ref 150–450)
RBC: 3.1 x10E6/uL — ABNORMAL LOW (ref 3.77–5.28)
RDW: 16.7 % — ABNORMAL HIGH (ref 11.7–15.4)
WBC: 13.5 10*3/uL — ABNORMAL HIGH (ref 3.4–10.8)

## 2018-12-26 LAB — TESTOSTERONE, FREE, TOTAL, SHBG
Sex Hormone Binding: 38.8 nmol/L (ref 24.6–122.0)
Testosterone, Free: 3 pg/mL (ref 0.0–4.2)
Testosterone: 36 ng/dL (ref 8–48)

## 2018-12-26 LAB — TSH: TSH: 4.2 u[IU]/mL (ref 0.450–4.500)

## 2018-12-26 LAB — SURGICAL PATHOLOGY

## 2018-12-26 LAB — LUTEINIZING HORMONE: LH: 28.6 m[IU]/mL

## 2018-12-26 LAB — FOLLICLE STIMULATING HORMONE: FSH: 4.6 m[IU]/mL

## 2018-12-26 MED ORDER — FLUCONAZOLE 150 MG PO TABS: 150.0000 mg | ORAL_TABLET | Freq: Once | ORAL | 0 refills | Status: AC

## 2018-12-26 MED ORDER — FERROUS SULFATE 325 (65 FE) MG PO TABS: 325.0000 mg | ORAL_TABLET | Freq: Two times a day (BID) | ORAL | 1 refills | Status: DC

## 2018-12-26 MED ORDER — METRONIDAZOLE 500 MG PO TABS: 500.0000 mg | ORAL_TABLET | Freq: Two times a day (BID) | ORAL | 0 refills | Status: DC

## 2018-12-26 NOTE — Addendum Note (Signed)
Addended by: [name] on: 12/26/2018 08:19 AM   Modules accepted: Orders

## 2019-01-02 ENCOUNTER — Ambulatory Visit (HOSPITAL_COMMUNITY)
Admission: RE | Admit: 2019-01-02 | Discharge: 2019-01-02 | Disposition: A | Discharge status: Home / Self Care | Payer: 59 | Source: Ambulatory Visit | Attending: Obstetrics and Gynecology | Admitting: Obstetrics and Gynecology

## 2019-01-02 ENCOUNTER — Other Ambulatory Visit: Payer: Self-pay

## 2019-01-02 DIAGNOSIS — Z01419 Encounter for gynecological examination (general) (routine) without abnormal findings: Secondary | ICD-10-CM | POA: Diagnosis present

## 2019-01-14 ENCOUNTER — Telehealth: Payer: 59 | Admitting: Obstetrics and Gynecology

## 2019-01-14 ENCOUNTER — Encounter: Payer: Self-pay | Admitting: Obstetrics and Gynecology

## 2019-01-14 ENCOUNTER — Telehealth (INDEPENDENT_AMBULATORY_CARE_PROVIDER_SITE_OTHER): Payer: 59 | Admitting: Obstetrics and Gynecology

## 2019-01-14 ENCOUNTER — Other Ambulatory Visit: Payer: Self-pay

## 2019-01-14 DIAGNOSIS — Z712 Person consulting for explanation of examination or test findings: Secondary | ICD-10-CM

## 2019-01-14 DIAGNOSIS — N938 Other specified abnormal uterine and vaginal bleeding: Secondary | ICD-10-CM

## 2019-01-14 NOTE — Progress Notes (Signed)
TELEHEALTH GYNECOLOGY VIRTUAL VIDEO VISIT ENCOUNTER NOTE  Provider location: [facility] [place]   I connected with [name] on 01/14/19 at 10:30 AM EST by MyChart Video Encounter at home and verified that I am speaking with the correct person using two identifiers.   I discussed the limitations, risks, security and privacy concerns of performing an evaluation and management service virtually and the availability of in person appointments. I also discussed with the patient that there may be a patient responsible charge related to this service. The patient expressed understanding and agreed to proceed.   History:  Kathleen Osborne is a 33 y.o. G95P2002 female being evaluated today to discuss test results and further management of her AUB. She denies any abnormal vaginal discharge, bleeding, pelvic pain or other concerns. Patient reports improvement in her vaginal bleeding with Megace. Reviewed benign endometrial biopsy results with the patient. Reviewed ultrasound findings of a small submucosal fibroid.       Past Medical History:  Diagnosis Date   Anxiety    no meds while pregnant   Depression    Family history of breast cancer    Family history of melanoma    Heartburn in pregnancy    Monoallelic mutation of CHEK2 gene    Past Surgical History:  Procedure Laterality Date   CERVICAL CONE BIOPSY     2006   CESAREAN SECTION     NRFHR 1st Csection.   CESAREAN SECTION N/A 10/24/2012   Procedure: REPEAT CESAREAN SECTION;  Surgeon: Shelly Bombard, MD;  Location: [facility];  Service: Obstetrics;  Laterality: N/A;   LEEP     TONSILLECTOMY AND ADENOIDECTOMY Bilateral 08/22/2017   Procedure: TONSILLECTOMY AND ADENOIDECTOMY;  Surgeon: Leta Baptist, MD;  Location: [facility];  Service: ENT;  Laterality: Bilateral;   The following portions of the patient's history were reviewed and updated as appropriate: allergies, current medications, past  family history, past medical history, past social history, past surgical history and problem list.    Review of Systems:  Pertinent items noted in HPI and remainder of comprehensive ROS otherwise negative.  Physical Exam:   General:  Alert, oriented and cooperative. Patient appears to be in no acute distress.  Mental Status: Normal mood and affect. Normal behavior. Normal judgment and thought content.   Respiratory: Normal respiratory effort, no problems with respiration noted  Rest of physical exam deferred due to type of encounter  Labs and Imaging No results found for this or any previous visit (from the past 336 hour(s)). US Pelvic Complete With Transvaginal  Result Date: 01/02/2019 CLINICAL DATA:  Abnormal uterine bleeding EXAM: TRANSABDOMINAL AND TRANSVAGINAL ULTRASOUND OF PELVIS TECHNIQUE: Both transabdominal and transvaginal ultrasound examinations of the pelvis were performed. Transabdominal technique was performed for global imaging of the pelvis including uterus, ovaries, adnexal regions, and pelvic cul-de-sac. It was necessary to proceed with endovaginal exam following the transabdominal exam to visualize the endometrium and LEFT ovary. COMPARISON:  None FINDINGS: Uterus Measurements: 9.5 x 4.7 x 6.7 cm = volume: 156 mL. Submucosal mass anterior wall uterus 2.0 x 1.4 x 2.3 cm likely submucosal leiomyoma Endometrium Thickness: 23 mm. Abnormal thickened and heterogeneous appearance, focally enlarged at the upper uterine segment, question focal mass/polyp Right ovary Measurements: 4.1 x 2.9 x 2.4 cm = volume: 14.5 mL. Multiple small follicles with a dominant follicle 1.2 cm greatest diameter. Left ovary Measurements: 5.0 x 3.2 x 3.1 cm. = volume: 25.8 mL. Multiple small follicles  with a dominant 2.4 cm diameter follicle. Other findings Trace free pelvic fluid.  No adnexal masses. IMPRESSION: Submucosal leiomyoma anterior wall upper uterus 2.3 cm greatest size. Abnormal thickened and  heterogeneous endometrial complex 23 mm thick, cannot exclude focal mass lesions/polyp, recommendation below. Consider further evaluation with sonohysterogram for confirmation prior to hysteroscopy. Endometrial sampling should also be considered if patient is at high risk for endometrial carcinoma. (Ref: Radiological Reasoning: Algorithmic Workup of Abnormal Vaginal Bleeding with Endovaginal Sonography and Sonohysterography. AJR 2008; 940:C05-05) Electronically Signed   By: Lavonia Dana M.D.   On: 01/02/2019 11:24       Assessment and Plan:     AUB likely due to submucosal fibroid - Discussed continue medical management with megace or progesterone only contraceptions vs surgical interventions with myosure and endometrial ablation. - All questions were answered and patient opted for [facility] with endometrial ablation. Patient is not interested in preserving her fertility. Risks, benefits and alternatives were explained including but not limited to risks of bleeding, infection, uterine perforation and damage to adjacent organs. Patient verbalized understanding and all questions were answered - Patient will be scheduled for [facility] with endometrial ablation      I discussed the assessment and treatment plan with the patient. The patient was provided an opportunity to ask questions and all were answered. The patient agreed with the plan and demonstrated an understanding of the instructions.   The patient was advised to call back or seek an in-person evaluation/go to the ED if the symptoms worsen or if the condition fails to improve as anticipated.  I provided 15 minutes of face-to-face time during this encounter.   [name], MD [facility]

## 2019-01-14 NOTE — Progress Notes (Signed)
Pt presents for mychart visit to discuss u/s results. Pt identified with 2 patient identifiers. Pt has no other concerns.

## 2019-01-17 ENCOUNTER — Other Ambulatory Visit: Payer: Self-pay | Admitting: Obstetrics and Gynecology

## 2019-02-12 ENCOUNTER — Other Ambulatory Visit: Payer: Self-pay | Admitting: Obstetrics and Gynecology

## 2019-02-12 MED ORDER — FLUCONAZOLE 150 MG PO TABS: 150.0000 mg | ORAL_TABLET | Freq: Once | ORAL | 0 refills | Status: AC

## 2019-02-28 ENCOUNTER — Other Ambulatory Visit: Payer: Self-pay

## 2019-02-28 ENCOUNTER — Encounter (HOSPITAL_BASED_OUTPATIENT_CLINIC_OR_DEPARTMENT_OTHER): Payer: Self-pay | Admitting: Obstetrics and Gynecology

## 2019-03-02 ENCOUNTER — Other Ambulatory Visit (HOSPITAL_COMMUNITY)
Admission: RE | Admit: 2019-03-02 | Discharge: 2019-03-02 | Disposition: A | Discharge status: Home / Self Care | Payer: 59 | Source: Ambulatory Visit | Attending: Obstetrics and Gynecology | Admitting: Obstetrics and Gynecology

## 2019-03-02 DIAGNOSIS — Z01812 Encounter for preprocedural laboratory examination: Secondary | ICD-10-CM | POA: Insufficient documentation

## 2019-03-02 DIAGNOSIS — Z20828 Contact with and (suspected) exposure to other viral communicable diseases: Secondary | ICD-10-CM | POA: Insufficient documentation

## 2019-03-03 LAB — NOVEL CORONAVIRUS, NAA (HOSP ORDER, SEND-OUT TO REF LAB; TAT 18-24 HRS): SARS-CoV-2, NAA: NOT DETECTED

## 2019-03-05 ENCOUNTER — Encounter (HOSPITAL_BASED_OUTPATIENT_CLINIC_OR_DEPARTMENT_OTHER)
Admission: RE | Admit: 2019-03-05 | Discharge: 2019-03-05 | Disposition: A | Discharge status: Home / Self Care | Payer: 59 | Source: Ambulatory Visit | Attending: Obstetrics and Gynecology | Admitting: Obstetrics and Gynecology

## 2019-03-05 ENCOUNTER — Other Ambulatory Visit: Payer: Self-pay

## 2019-03-05 DIAGNOSIS — Z6841 Body Mass Index (BMI) 40.0 and over, adult: Secondary | ICD-10-CM | POA: Diagnosis not present

## 2019-03-05 DIAGNOSIS — Z87891 Personal history of nicotine dependence: Secondary | ICD-10-CM | POA: Diagnosis not present

## 2019-03-05 DIAGNOSIS — F329 Major depressive disorder, single episode, unspecified: Secondary | ICD-10-CM | POA: Diagnosis not present

## 2019-03-05 DIAGNOSIS — F419 Anxiety disorder, unspecified: Secondary | ICD-10-CM | POA: Diagnosis not present

## 2019-03-05 DIAGNOSIS — Z01812 Encounter for preprocedural laboratory examination: Secondary | ICD-10-CM | POA: Insufficient documentation

## 2019-03-05 DIAGNOSIS — N938 Other specified abnormal uterine and vaginal bleeding: Secondary | ICD-10-CM | POA: Diagnosis not present

## 2019-03-05 DIAGNOSIS — D250 Submucous leiomyoma of uterus: Secondary | ICD-10-CM | POA: Diagnosis not present

## 2019-03-05 DIAGNOSIS — D649 Anemia, unspecified: Secondary | ICD-10-CM | POA: Diagnosis not present

## 2019-03-05 DIAGNOSIS — Z79899 Other long term (current) drug therapy: Secondary | ICD-10-CM | POA: Diagnosis not present

## 2019-03-05 LAB — CBC
HCT: 37.6 % (ref 36.0–46.0)
Hemoglobin: 11.3 g/dL — ABNORMAL LOW (ref 12.0–15.0)
MCH: 22.9 pg — ABNORMAL LOW (ref 26.0–34.0)
MCHC: 30.1 g/dL (ref 30.0–36.0)
MCV: 76.3 fL — ABNORMAL LOW (ref 80.0–100.0)
Platelets: 408 10*3/uL — ABNORMAL HIGH (ref 150–400)
RBC: 4.93 MIL/uL (ref 3.87–5.11)
RDW: 14.5 % (ref 11.5–15.5)
WBC: 11.9 10*3/uL — ABNORMAL HIGH (ref 4.0–10.5)
nRBC: 0 % (ref 0.0–0.2)

## 2019-03-05 LAB — TYPE AND SCREEN
ABO/RH(D): A POS
Antibody Screen: NEGATIVE

## 2019-03-05 LAB — POCT PREGNANCY, URINE: Preg Test, Ur: NEGATIVE

## 2019-03-05 LAB — ABO/RH: ABO/RH(D): A POS

## 2019-03-05 NOTE — Progress Notes (Signed)
Anesthesia consult per Dr. Conrad [place], will proceed with surgery as scheduled.

## 2019-03-05 NOTE — Progress Notes (Signed)

## 2019-03-06 ENCOUNTER — Ambulatory Visit (HOSPITAL_BASED_OUTPATIENT_CLINIC_OR_DEPARTMENT_OTHER): Payer: 59 | Admitting: Certified Registered"

## 2019-03-06 ENCOUNTER — Other Ambulatory Visit: Payer: Self-pay

## 2019-03-06 ENCOUNTER — Encounter (HOSPITAL_BASED_OUTPATIENT_CLINIC_OR_DEPARTMENT_OTHER): Payer: Self-pay | Admitting: Obstetrics and Gynecology

## 2019-03-06 ENCOUNTER — Ambulatory Visit (HOSPITAL_BASED_OUTPATIENT_CLINIC_OR_DEPARTMENT_OTHER)
Admission: RE | Admit: 2019-03-06 | Discharge: 2019-03-06 | Disposition: A | Discharge status: Home / Self Care | Payer: 59 | Source: Ambulatory Visit | Attending: Obstetrics and Gynecology | Admitting: Obstetrics and Gynecology

## 2019-03-06 ENCOUNTER — Encounter (HOSPITAL_BASED_OUTPATIENT_CLINIC_OR_DEPARTMENT_OTHER)
Admission: RE | Disposition: A | Discharge status: Home / Self Care | Payer: Self-pay | Source: Ambulatory Visit | Attending: Obstetrics and Gynecology

## 2019-03-06 DIAGNOSIS — N938 Other specified abnormal uterine and vaginal bleeding: Secondary | ICD-10-CM | POA: Diagnosis not present

## 2019-03-06 DIAGNOSIS — F419 Anxiety disorder, unspecified: Secondary | ICD-10-CM | POA: Insufficient documentation

## 2019-03-06 DIAGNOSIS — D250 Submucous leiomyoma of uterus: Secondary | ICD-10-CM | POA: Insufficient documentation

## 2019-03-06 DIAGNOSIS — F329 Major depressive disorder, single episode, unspecified: Secondary | ICD-10-CM | POA: Insufficient documentation

## 2019-03-06 DIAGNOSIS — Z87891 Personal history of nicotine dependence: Secondary | ICD-10-CM | POA: Insufficient documentation

## 2019-03-06 DIAGNOSIS — Z79899 Other long term (current) drug therapy: Secondary | ICD-10-CM | POA: Insufficient documentation

## 2019-03-06 DIAGNOSIS — N939 Abnormal uterine and vaginal bleeding, unspecified: Secondary | ICD-10-CM

## 2019-03-06 DIAGNOSIS — Z6841 Body Mass Index (BMI) 40.0 and over, adult: Secondary | ICD-10-CM | POA: Insufficient documentation

## 2019-03-06 DIAGNOSIS — D649 Anemia, unspecified: Secondary | ICD-10-CM | POA: Insufficient documentation

## 2019-03-06 HISTORY — DX: Other complications of anesthesia, initial encounter: T88.59XA

## 2019-03-06 HISTORY — PX: DILITATION & CURRETTAGE/HYSTROSCOPY WITH HYDROTHERMAL ABLATION: SHX5570

## 2019-03-06 SURGERY — DILATATION & CURETTAGE/HYSTEROSCOPY WITH HYDROTHERMAL ABLATION
Anesthesia: General | Site: Vagina

## 2019-03-06 MED ORDER — ONDANSETRON HCL 4 MG/2ML IJ SOLN
INTRAMUSCULAR | Status: AC
  Filled 2019-03-06: qty 2

## 2019-03-06 MED ORDER — PROPOFOL 10 MG/ML IV BOLUS
INTRAVENOUS | Status: DC | PRN
  Administered 2019-03-06: 200 mg via INTRAVENOUS

## 2019-03-06 MED ORDER — OXYCODONE-ACETAMINOPHEN 5-325 MG PO TABS: 1.0000 | ORAL_TABLET | Freq: Four times a day (QID) | ORAL | 0 refills | Status: DC | PRN

## 2019-03-06 MED ORDER — MIDAZOLAM HCL 2 MG/2ML IJ SOLN
INTRAMUSCULAR | Status: AC
  Filled 2019-03-06: qty 2

## 2019-03-06 MED ORDER — SODIUM CHLORIDE 0.9 % IR SOLN
Status: DC | PRN
  Administered 2019-03-06: 3000 mL

## 2019-03-06 MED ORDER — OXYCODONE HCL 5 MG/5ML PO SOLN: 5.0000 mg | Freq: Once | ORAL | Status: AC | PRN

## 2019-03-06 MED ORDER — DEXAMETHASONE SODIUM PHOSPHATE 10 MG/ML IJ SOLN
INTRAMUSCULAR | Status: AC
  Filled 2019-03-06: qty 1

## 2019-03-06 MED ORDER — IBUPROFEN 600 MG PO TABS: 600.0000 mg | ORAL_TABLET | Freq: Four times a day (QID) | ORAL | 3 refills | Status: DC | PRN

## 2019-03-06 MED ORDER — CHLOROPROCAINE HCL 1 % IJ SOLN
INTRAMUSCULAR | Status: DC | PRN
  Administered 2019-03-06: 10 mL

## 2019-03-06 MED ORDER — CHLOROPROCAINE HCL 1 % IJ SOLN
INTRAMUSCULAR | Status: AC
  Filled 2019-03-06: qty 30

## 2019-03-06 MED ORDER — SUGAMMADEX SODIUM 200 MG/2ML IV SOLN
INTRAVENOUS | Status: DC | PRN
  Administered 2019-03-06: 200 mg via INTRAVENOUS

## 2019-03-06 MED ORDER — ACETAMINOPHEN 10 MG/ML IV SOLN
1000.0000 mg | Freq: Four times a day (QID) | INTRAVENOUS | Status: DC
  Administered 2019-03-06: 1000 mg via INTRAVENOUS

## 2019-03-06 MED ORDER — ONDANSETRON HCL 4 MG/2ML IJ SOLN
INTRAMUSCULAR | Status: DC | PRN
  Administered 2019-03-06: 4 mg via INTRAVENOUS

## 2019-03-06 MED ORDER — OXYCODONE HCL 5 MG PO TABS
5.0000 mg | ORAL_TABLET | Freq: Once | ORAL | Status: AC | PRN
  Administered 2019-03-06: 10:00:00 5 mg via ORAL

## 2019-03-06 MED ORDER — ACETAMINOPHEN 10 MG/ML IV SOLN
INTRAVENOUS | Status: AC
  Filled 2019-03-06: qty 100

## 2019-03-06 MED ORDER — DEXMEDETOMIDINE HCL IN NACL 400 MCG/100ML IV SOLN
INTRAVENOUS | Status: DC | PRN
  Administered 2019-03-06: 12 ug via INTRAVENOUS

## 2019-03-06 MED ORDER — ONDANSETRON HCL 4 MG/2ML IJ SOLN
4.0000 mg | Freq: Once | INTRAMUSCULAR | Status: AC | PRN
  Administered 2019-03-06: 10:00:00 4 mg via INTRAVENOUS

## 2019-03-06 MED ORDER — LIDOCAINE HCL (CARDIAC) PF 100 MG/5ML IV SOSY
PREFILLED_SYRINGE | INTRAVENOUS | Status: DC | PRN
  Administered 2019-03-06: 100 mg via INTRAVENOUS

## 2019-03-06 MED ORDER — METHYLERGONOVINE MALEATE 0.2 MG/ML IJ SOLN
INTRAMUSCULAR | Status: AC
  Filled 2019-03-06: qty 1

## 2019-03-06 MED ORDER — DOCUSATE SODIUM 100 MG PO CAPS: 100.0000 mg | ORAL_CAPSULE | Freq: Two times a day (BID) | ORAL | 2 refills | Status: DC | PRN

## 2019-03-06 MED ORDER — FENTANYL CITRATE (PF) 100 MCG/2ML IJ SOLN
INTRAMUSCULAR | Status: DC | PRN
  Administered 2019-03-06: 100 ug via INTRAVENOUS

## 2019-03-06 MED ORDER — ROCURONIUM BROMIDE 100 MG/10ML IV SOLN
INTRAVENOUS | Status: DC | PRN
  Administered 2019-03-06: 50 mg via INTRAVENOUS

## 2019-03-06 MED ORDER — FENTANYL CITRATE (PF) 100 MCG/2ML IJ SOLN: 25.0000 ug | INTRAMUSCULAR | Status: DC | PRN

## 2019-03-06 MED ORDER — OXYCODONE HCL 5 MG PO TABS
ORAL_TABLET | ORAL | Status: AC
  Filled 2019-03-06: qty 1

## 2019-03-06 MED ORDER — ACETAMINOPHEN 10 MG/ML IV SOLN: 1000.0000 mg | Freq: Once | INTRAVENOUS | Status: DC

## 2019-03-06 MED ORDER — KETOROLAC TROMETHAMINE 30 MG/ML IJ SOLN
INTRAMUSCULAR | Status: DC | PRN
  Administered 2019-03-06: 30 mg via INTRAVENOUS

## 2019-03-06 MED ORDER — DEXAMETHASONE SODIUM PHOSPHATE 4 MG/ML IJ SOLN
INTRAMUSCULAR | Status: DC | PRN
  Administered 2019-03-06: 10 mg via INTRAVENOUS

## 2019-03-06 MED ORDER — LIDOCAINE 2% (20 MG/ML) 5 ML SYRINGE
INTRAMUSCULAR | Status: AC
  Filled 2019-03-06: qty 5

## 2019-03-06 MED ORDER — LACTATED RINGERS IV SOLN: INTRAVENOUS | Status: DC

## 2019-03-06 MED ORDER — FENTANYL CITRATE (PF) 100 MCG/2ML IJ SOLN
INTRAMUSCULAR | Status: AC
  Filled 2019-03-06: qty 2

## 2019-03-06 MED ORDER — PROPOFOL 10 MG/ML IV BOLUS
INTRAVENOUS | Status: AC
  Filled 2019-03-06: qty 40

## 2019-03-06 MED ORDER — MIDAZOLAM HCL 5 MG/5ML IJ SOLN
INTRAMUSCULAR | Status: DC | PRN
  Administered 2019-03-06: 2 mg via INTRAVENOUS

## 2019-03-06 SURGICAL SUPPLY — 15 items
CATH ROBINSON RED A/P 16FR (CATHETERS) ×2 IMPLANT
CONTAINER PREFILL 10% NBF 60ML (FORM) ×4 IMPLANT
GLOVE BIOGEL PI IND STRL 6.5 (GLOVE) ×1 IMPLANT
GLOVE BIOGEL PI IND STRL 7.0 (GLOVE) ×1 IMPLANT
GLOVE BIOGEL PI INDICATOR 6.5 (GLOVE) ×1
GLOVE BIOGEL PI INDICATOR 7.0 (GLOVE) ×1
GLOVE SURG SS PI 6.0 STRL IVOR (GLOVE) ×2 IMPLANT
GOWN STRL REUS W/TWL LRG LVL3 (GOWN DISPOSABLE) ×4 IMPLANT
KIT PROCEDURE FLUENT (KITS) ×1 IMPLANT
PACK VAGINAL MINOR WOMEN LF (CUSTOM PROCEDURE TRAY) ×2 IMPLANT
PAD OB MATERNITY 4.3X12.25 (PERSONAL CARE ITEMS) ×2 IMPLANT
PAD PREP 24X48 CUFFED NSTRL (MISCELLANEOUS) ×2 IMPLANT
SET GENESYS HTA PROCERVA (MISCELLANEOUS) ×2 IMPLANT
SLEEVE SCD COMPRESS KNEE MED (MISCELLANEOUS) ×4 IMPLANT
TOWEL GREEN STERILE FF (TOWEL DISPOSABLE) ×4 IMPLANT

## 2019-03-06 NOTE — Anesthesia Postprocedure Evaluation (Signed)
Anesthesia Post Note  Patient: Kathleen Osborne  Procedure(s) Performed: DILATATION & CURETTAGE/HYSTEROSCOPY WITH HYDROTHERMAL ABLATION (N/A Vagina )     Patient location during evaluation: PACU Anesthesia Type: General Level of consciousness: awake and alert Pain management: pain level controlled Vital Signs Assessment: post-procedure vital signs reviewed and stable Respiratory status: spontaneous breathing, nonlabored ventilation and respiratory function stable Cardiovascular status: blood pressure returned to baseline and stable Postop Assessment: no apparent nausea or vomiting Anesthetic complications: no    Last Vitals:  Vitals:   03/06/19 0930 03/06/19 1200  BP:  135/89  Pulse: 95 88  Resp: 15 16  Temp:  37.1 C  SpO2: 99% 99%    Last Pain:  Vitals:   03/06/19 1200  TempSrc:   PainSc: 5                  Lidia Collum

## 2019-03-06 NOTE — Op Note (Signed)
PREOPERATIVE DIAGNOSIS:  Abnormal uterine bleeding POSTOPERATIVE DIAGNOSIS: The same PROCEDURE: Hysteroscopy,  Hydrothermal Endometrial Ablation SURGEON:  Dr. [name]  INDICATIONS: 33 y.o. [id number] here for scheduled surgery for abnormal uterine bleeding. Risks of surgery were discussed with the patient including but not limited to: bleeding; infection which may require antibiotics; injury to uterus leading to risk of injury to surrounding intraperitoneal organs, burn injury to vagina or other organs, need for additional procedures including laparoscopy or laparotomy, inability to complete ablation due to uterine or mechanical anomaly, and other postoperative/anesthesia complications.  Patient was informed that there is a high likelihood of success of controlling her symptoms; however about 5% of patients may require further intervention.  Written informed consent was obtained.    FINDINGS:  A 9 week size uterus.  Diffuse proliferative endometrium.  Normal ostia bilaterally.  ANESTHESIA:   General, paracervical block. INTRAVENOUS FLUIDS:  900 ml of LR FLUID DEFICIT: 100 ml LR ESTIMATED BLOOD LOSS:  20 ml SPECIMENS: Endometrial curettings sent to pathology COMPLICATIONS:  None immediate.  PROCEDURE DETAILS:  The patient was then taken to the operating room where general anesthesia was administered and was found to be adequate.  After an adequate timeout was performed, she was placed in the dorsal lithotomy position and examined; then prepped and draped in the sterile manner.   Her bladder was catheterized for clear, yellow urine. A speculum was then placed in the patient's vagina and a single tooth tenaculum was applied to the anterior lip of the cervix.   A paracervical block using 10 ml of 0.5% Marcaine was administered.  The cervix was sounded to 9 cm and dilated manually with metal dilators to accommodate the hydrothermal ablation hysteroscopic apparatus.  Once the cervix was dilated, a  sharp curettage was then performed to obtain a moderate amount of endometrial curettings.  The hysteroscope was inserted under direct visualization using normal saline as a suspension medium.  The uterine cavity was carefully examined, both ostia were recognized, and diffusely proliferative endometrium was noted.   The hydrothermal ablation was then carried out as per protocol.   Complete ablation of the endometrium was observed and the hysteroscope was removed under direct visualization.  No complications were observed.  The tenaculum was removed from the anterior lip of the cervix, and the vaginal speculum was removed after noting good hemostasis.  The patient tolerated the procedure well and was taken to the recovery area awake, extubated and in stable condition.  The patient will be discharged to home as per PACU criteria.  Routine postoperative instructions given.  She was prescribed Percocet, Ibuprofen and Colace.

## 2019-03-06 NOTE — Anesthesia Preprocedure Evaluation (Addendum)
Anesthesia Evaluation  Patient identified by MRN, date of birth, ID band Patient awake    Reviewed: Allergy & Precautions, NPO status , Patient's Chart, lab work & pertinent test results  History of Anesthesia Complications Negative for: history of anesthetic complications  Airway Mallampati: II  TM Distance: >3 FB Neck ROM: Full    Dental  (+) Teeth Intact   Pulmonary neg pulmonary ROS, Patient abstained from smoking., former smoker,    Pulmonary exam normal        Cardiovascular negative cardio ROS Normal cardiovascular exam     Neuro/Psych PSYCHIATRIC DISORDERS Anxiety Depression negative neurological ROS     GI/Hepatic negative GI ROS, Neg liver ROS,   Endo/Other  Morbid obesity  Renal/GU negative Renal ROS  negative genitourinary   Musculoskeletal negative musculoskeletal ROS (+)   Abdominal (+) + obese,   Peds  Hematology  (+) anemia ,   Anesthesia Other Findings   Reproductive/Obstetrics                           Anesthesia Physical Anesthesia Plan  ASA: III  Anesthesia Plan: General   Post-op Pain Management:    Induction: Intravenous  PONV Risk Score and Plan: 3 and Ondansetron, Dexamethasone, Treatment may vary due to age or medical condition and Midazolam  Airway Management Planned: Oral ETT and LMA  Additional Equipment: None  Intra-op Plan:   Post-operative Plan: Extubation in OR  Informed Consent: I have reviewed the patients History and Physical, chart, labs and discussed the procedure including the risks, benefits and alternatives for the proposed anesthesia with the patient or authorized representative who has indicated his/her understanding and acceptance.     Dental advisory given  Plan Discussed with:   Anesthesia Plan Comments:        Anesthesia Quick Evaluation

## 2019-03-06 NOTE — H&P (Signed)
Kathleen Osborne is an 33 y.o. female P2 with BMI 4 presenting today for scheduled Hysteroscopy with myosure and endometrial ablation for the treatment of DUB. Patient with history of DUB over the past few months with vaginal bleeding for up to 43 days. She was started on megace with good results. Pelvic ultrasound demonstrated the presence of a submucosal fibroid. Patient is without other complaints  Pertinent Gynecological History: Bleeding: dysfunctional uterine bleeding Contraception: none DES exposure: denies Blood transfusions: none Sexually transmitted diseases: no past history Last pap: normal Date: 12/2018 OB History:P2   Menstrual History: No LMP recorded. (Menstrual status: Oral contraceptives).    Past Medical History:  Diagnosis Date   Anxiety    no meds while pregnant   Complication of anesthesia    very anxious on emergence   Depression    Family history of breast cancer    Family history of melanoma    Heartburn in pregnancy    Monoallelic mutation of CHEK2 gene     Past Surgical History:  Procedure Laterality Date   CERVICAL CONE BIOPSY     2006   CESAREAN SECTION     NRFHR 1st Csection.   CESAREAN SECTION N/A 10/24/2012   Procedure: REPEAT CESAREAN SECTION;  Surgeon: Shelly Bombard, MD;  Location: [facility];  Service: Obstetrics;  Laterality: N/A;   LEEP     TONSILLECTOMY AND ADENOIDECTOMY Bilateral 08/22/2017   Procedure: TONSILLECTOMY AND ADENOIDECTOMY;  Surgeon: Leta Baptist, MD;  Location: [facility];  Service: ENT;  Laterality: Bilateral;    Family History  Problem Relation Age of Onset   Melanoma Mother 16   Early death Mother    Hyperlipidemia Father    Breast cancer Maternal Grandmother        dx in her 81s   Parkinson's disease Maternal Grandfather    Depression Maternal Grandfather    Drug abuse Maternal Grandfather    Mental illness Maternal Grandfather    Prostate cancer Maternal Grandfather    Colon cancer Maternal  Grandfather    Breast cancer Maternal Aunt 33   Prostate cancer Paternal Grandfather    Melanoma Sister 30   Mental illness Maternal Aunt     Social History:  reports that she quit smoking about 8 years ago. Her smoking use included e-cigarettes. She has never used smokeless tobacco. She reports current alcohol use. She reports that she does not use drugs.  Allergies: No Known Allergies  Medications Prior to Admission  Medication Sig Dispense Refill Last Dose   ferrous sulfate (FERROUSUL) 325 (65 FE) MG tablet Take 1 tablet (325 mg total) by mouth 2 (two) times daily. 60 tablet 1 03/05/2019 at Unknown time   ibuprofen (ADVIL,MOTRIN) 200 MG tablet Take 200 mg by mouth every 6 (six) hours as needed.   Past Week at Unknown time   ISIBLOOM 0.15-30 MG-MCG tablet    03/05/2019 at Unknown time   sertraline (ZOLOFT) 50 MG tablet TAKE 1 AND 1/2 TABLETS BY MOUTH EVERY DAY 45 tablet 2 03/06/2019 at 0445   vitamin B-12 (CYANOCOBALAMIN) 100 MCG tablet Take 100 mcg by mouth daily.   03/05/2019 at Unknown time   VITAMIN D PO Take by mouth.   03/05/2019 at Unknown time   ondansetron (ZOFRAN) 4 MG tablet Take 1 tablet (4 mg total) by mouth every 6 (six) hours. (Patient not taking: Reported on 01/14/2019) 12 tablet 0     Review of Systems See pertinent in HPI Blood pressure (!) 140/93, pulse  84, temperature 97.8 F (36.6 C), temperature source Temporal, resp. rate 16, height '5\' 1"'  (1.549 m), weight 260 lb 12.9 oz (118.3 kg), SpO2 100 %. Physical Exam GENERAL: Well-developed, well-nourished female in no acute distress.  LUNGS: Clear to auscultation bilaterally.  HEART: Regular rate and rhythm. ABDOMEN: Soft, nontender, nondistended. No organomegaly. PELVIC: Deferred to OR. EXTREMITIES: No cyanosis, clubbing, or edema, 2+ distal pulses.  Results for orders placed or performed during the hospital encounter of 03/06/19 (from the past 24 hour(s))  Type and screen     Status: None   Collection Time:  03/05/19  9:30 AM  Result Value Ref Range   ABO/RH(D) A POS    Antibody Screen NEG    Sample Expiration      03/08/2019,2359 Performed at [facility], [place] [address], [place], [place] [address]   ABO/Rh     Status: None   Collection Time: 03/05/19  9:30 AM  Result Value Ref Range   ABO/RH(D)      A POS Performed at [facility] [address] [place], [place] [address]   CBC     Status: Abnormal   Collection Time: 03/05/19 12:00 PM  Result Value Ref Range   WBC 11.9 (H) 4.0 - 10.5 K/uL   RBC 4.93 3.87 - 5.11 MIL/uL   Hemoglobin 11.3 (L) 12.0 - 15.0 g/dL   HCT 37.6 36.0 - 46.0 %   MCV 76.3 (L) 80.0 - 100.0 fL   MCH 22.9 (L) 26.0 - 34.0 pg   MCHC 30.1 30.0 - 36.0 g/dL   RDW 14.5 11.5 - 15.5 %   Platelets 408 (H) 150 - 400 K/uL   nRBC 0.0 0.0 - 0.2 %    12/2018 ultrasound FINDINGS: Uterus   Measurements: 9.5 x 4.7 x 6.7 cm = volume: 156 mL. Submucosal mass anterior wall uterus 2.0 x 1.4 x 2.3 cm likely submucosal leiomyoma   Endometrium   Thickness: 23 mm. Abnormal thickened and heterogeneous appearance, focally enlarged at the upper uterine segment, question focal mass/polyp   Right ovary   Measurements: 4.1 x 2.9 x 2.4 cm = volume: 14.5 mL. Multiple small follicles with a dominant follicle 1.2 cm greatest diameter.   Left ovary   Measurements: 5.0 x 3.2 x 3.1 cm. = volume: 25.8 mL. Multiple small follicles with a dominant 2.4 cm diameter follicle.   Other findings   Trace free pelvic fluid.  No adnexal masses.   IMPRESSION: Submucosal leiomyoma anterior wall upper uterus 2.3 cm greatest size.   Abnormal thickened and heterogeneous endometrial complex 23 mm thick, cannot exclude focal mass lesions/polyp, recommendation below.   Consider further evaluation with sonohysterogram for confirmation prior to hysteroscopy. Endometrial sampling should also be considered if patient is at high risk for endometrial carcinoma. (Ref: Radiological  Reasoning: Algorithmic Workup of Abnormal Vaginal Bleeding with Endovaginal Sonography and Sonohysterography. AJR 2008; [id number])     Electronically Signed   By: Lavonia Dana M.D.   On: 01/02/2019 11:24  12/2018 endometrial biopsy- benign secretory endometrium. No hyperplasia or malignancy  Assessment/Plan: 33 yo with DUB here for endometrial ablation with myosure - risks, benefits and alternatives were explained including but not limited to risks of bleeding, infection, uterine perforation and damage to adjacent organs. Patient verbalized understanding and all questions were answered - Patient understands that this procedure will significantly decrease her ability to conceive - Discharge instructions were reviewed with the patient Garrison Michie 03/06/2019, 8:05 AM

## 2019-03-06 NOTE — Anesthesia Procedure Notes (Signed)
Procedure Name: Intubation Performed by: Margean Korell M, CRNA Pre-anesthesia Checklist: Patient identified, Emergency Drugs available, Suction available and Patient being monitored Patient Re-evaluated:Patient Re-evaluated prior to induction Oxygen Delivery Method: Circle system utilized Preoxygenation: Pre-oxygenation with 100% oxygen Induction Type: IV induction Ventilation: Mask ventilation without difficulty Laryngoscope Size: Mac and 3 Grade View: Grade I Tube type: Oral Tube size: 7.0 mm Number of attempts: 1 Airway Equipment and Method: Stylet and Oral airway Placement Confirmation: ETT inserted through vocal cords under direct vision,  positive ETCO2 and breath sounds checked- equal and bilateral Secured at: 21 cm Tube secured with: Tape Dental Injury: Teeth and Oropharynx as per pre-operative assessment        

## 2019-03-06 NOTE — Transfer of Care (Signed)
Immediate Anesthesia Transfer of Care Note  Patient: Kathleen Osborne  Procedure(s) Performed: DILATATION & CURETTAGE/HYSTEROSCOPY WITH HYDROTHERMAL ABLATION AND MYOSURE (N/A Vagina )  Patient Location: PACU  Anesthesia Type:General  Level of Consciousness: awake, alert  and oriented  Airway & Oxygen Therapy: Patient Spontanous Breathing and Patient connected to face mask oxygen  Post-op Assessment: Report given to RN and Post -op Vital signs reviewed and stable  Post vital signs: Reviewed and stable  Last Vitals:  Vitals Value Taken Time  BP 122/70 03/06/19 0911  Temp    Pulse 85 03/06/19 0912  Resp 19 03/06/19 0913  SpO2 96 % 03/06/19 0912  Vitals shown include unvalidated device data.  Last Pain:  Vitals:   03/06/19 0740  TempSrc: Temporal  PainSc: 0-No pain         Complications: No apparent anesthesia complications

## 2019-03-06 NOTE — Discharge Instructions (Signed)
Endometrial Ablation, Care After This sheet gives you information about how to care for yourself after your procedure. Your health care provider may also give you more specific instructions. If you have problems or questions, contact your health care provider. What can I expect after the procedure? After the procedure, it is common to have:  A need to urinate more frequently than usual for the first 24 hours.  Cramps similar to menstrual cramps. These may last for 1-2 days.  Thin, watery vaginal discharge that is light pink or brown in color. This may last a few weeks. Discharge will be heavy for the first few days after your procedure. You may need to wear a sanitary pad.  Nausea.  Vaginal bleeding for 4-6 weeks after the procedure, as tissue healing occurs. Follow these instructions at home: Activity  Do not drive for 24 hours if you were given amedicine to help you relax (sedative) during your procedure.  Do not have sex or put anything into your vagina until your health care provider approves.  Do not lift anything that is heavier than 10 lb (4.5 kg), or the limit that you are told, until your health care provider says that it is safe.  Return to your normal activities as told by your health care provider. Ask your health care provider what activities are safe for you. General instructions   Take over-the-counter and prescription medicines only as told by your health care provider.  Do not take baths, swim, or use a hot tub until your health care provider approves. You will be able to take showers.  Check your vaginal area every day for signs of infection. Check for: ? Redness, swelling, or pain. ? More discharge or blood, instead of less. ? Bad-smelling discharge.  Keep all follow-up visits as told by your health care provider. This is important.  Drink enough fluid to keep your urine pale yellow. Contact a health care provider if you have:  Vaginal redness, swelling, or  pain.  Vaginal discharge or bleeding that gets worse instead of getting better.  Bad-smelling vaginal discharge.  A fever or chills.  Trouble urinating. Get help right away if you have:  Heavy vaginal bleeding.  Severe cramps. Summary  After endometrial ablation, it is normal to have thin, watery vaginal discharge that is light pink or brown in color. This may last a few weeks and may be heavier right after the procedure.  Vaginal bleeding is also normal after the procedure and should get better with time.  Check your vaginal area every day for signs of infection, such as bad-smelling discharge.  Keep all follow-up visits as told by your health care provider. This is important. This information is not intended to replace advice given to you by your health care provider. Make sure you discuss any questions you have with your health care provider. Document Released: 01/10/2017 Document Revised: 06/21/2018 Document Reviewed: 01/10/2017 Elsevier Patient Education  [place].   No Ibuprofen until 3pm if needed   Post Anesthesia Home Care Instructions  Activity: Get plenty of rest for the remainder of the day. A responsible individual must stay with you for 24 hours following the procedure.  For the next 24 hours, DO NOT: -Drive a car -Paediatric nurse -Drink alcoholic beverages -Take any medication unless instructed by your physician -Make any legal decisions or sign important papers.  Meals: Start with liquid foods such as gelatin or soup. Progress to regular foods as tolerated. Avoid greasy, spicy, heavy foods.  If nausea and/or vomiting occur, drink only clear liquids until the nausea and/or vomiting subsides. Call your physician if vomiting continues.  Special Instructions/Symptoms: Your throat may feel dry or sore from the anesthesia or the breathing tube placed in your throat during surgery. If this causes discomfort, gargle with warm salt water. The discomfort  should disappear within 24 hours.  If you had a scopolamine patch placed behind your ear for the management of post- operative nausea and/or vomiting:  1. The medication in the patch is effective for 72 hours, after which it should be removed.  Wrap patch in a tissue and discard in the trash. Wash hands thoroughly with soap and water. 2. You may remove the patch earlier than 72 hours if you experience unpleasant side effects which may include dry mouth, dizziness or visual disturbances. 3. Avoid touching the patch. Wash your hands with soap and water after contact with the patch.

## 2019-03-07 ENCOUNTER — Encounter: Payer: Self-pay | Admitting: *Deleted

## 2019-03-17 ENCOUNTER — Other Ambulatory Visit: Payer: Self-pay | Admitting: Family Medicine

## 2019-03-17 DIAGNOSIS — F329 Major depressive disorder, single episode, unspecified: Secondary | ICD-10-CM

## 2019-03-17 DIAGNOSIS — F32A Depression, unspecified: Secondary | ICD-10-CM

## 2019-03-26 ENCOUNTER — Encounter: Payer: Self-pay | Admitting: Obstetrics and Gynecology

## 2019-03-26 ENCOUNTER — Telehealth (INDEPENDENT_AMBULATORY_CARE_PROVIDER_SITE_OTHER): Payer: 59 | Admitting: Obstetrics and Gynecology

## 2019-03-26 DIAGNOSIS — Z9889 Other specified postprocedural states: Secondary | ICD-10-CM

## 2019-03-26 NOTE — Progress Notes (Signed)
TELEHEALTH GYNECOLOGY VIRTUAL VIDEO VISIT ENCOUNTER NOTE  Provider location: [facility] [place]   I connected with Kathleen Osborne on 03/26/19 at  1:15 PM EST by MyChart Video Encounter at home and verified that I am speaking with the correct person using two identifiers.   I discussed the limitations, risks, security and privacy concerns of performing an evaluation and management service virtually and the availability of in person appointments. I also discussed with the patient that there may be a patient responsible charge related to this service. The patient expressed understanding and agreed to proceed.   History:  Kathleen Osborne is a 34 y.o. [phone] female being evaluated today for post op check s/p endometrial ablation on 03/06/19. She denies any abnormal vaginal discharge, bleeding, pelvic pain or other concerns. Patient reports minimal post op pain well controlled with ibuprofen.       Past Medical History:  Diagnosis Date  . Anxiety    no meds while pregnant  . Complication of anesthesia    very anxious on emergence  . Depression   . Family history of breast cancer   . Family history of melanoma   . Heartburn in pregnancy   . Monoallelic mutation of CHEK2 gene    Past Surgical History:  Procedure Laterality Date  . CERVICAL CONE BIOPSY     2006  . CESAREAN SECTION     NRFHR 1st Csection.  Marland Kitchen CESAREAN SECTION N/A 10/24/2012   Procedure: REPEAT CESAREAN SECTION;  Surgeon: Shelly Bombard, MD;  Location: [facility];  Service: Obstetrics;  Laterality: N/A;  . DILITATION & CURRETTAGE/HYSTROSCOPY WITH HYDROTHERMAL ABLATION N/A 03/06/2019   Procedure: DILATATION & CURETTAGE/HYSTEROSCOPY WITH HYDROTHERMAL ABLATION;  Surgeon: [name], MD;  Location: [facility];  Service: Gynecology;  Laterality: N/A;  . LEEP    . TONSILLECTOMY AND ADENOIDECTOMY Bilateral 08/22/2017   Procedure: TONSILLECTOMY AND ADENOIDECTOMY;  Surgeon: Leta Baptist, MD;   Location: [facility];  Service: ENT;  Laterality: Bilateral;   The following portions of the patient's history were reviewed and updated as appropriate: allergies, current medications, past family history, past medical history, past social history, past surgical history and problem list.   Health Maintenance:  Patient is overdue for a pap smear.   Review of Systems:  Pertinent items noted in HPI and remainder of comprehensive ROS otherwise negative.  Physical Exam:   General:  Alert, oriented and cooperative. Patient appears to be in no acute distress.  Mental Status: Normal mood and affect. Normal behavior. Normal judgment and thought content.   Respiratory: Normal respiratory effort, no problems with respiration noted  Rest of physical exam deferred due to type of encounter  Labs and Imaging No results found for this or any previous visit (from the past 336 hour(s)). No results found.     Assessment and Plan:     34 yo s/p endometrial ablation due to DUB here for post op check - Patient is medically cleared to resume all activities of daily living - Patient advised to keep track of her menstrual calendar - RTC for annual exam with pap smear.      I discussed the assessment and treatment plan with the patient. The patient was provided an opportunity to ask questions and all were answered. The patient agreed with the plan and demonstrated an understanding of the instructions.   The patient was advised to call back or seek an in-person evaluation/go to the ED if  the symptoms worsen or if the condition fails to improve as anticipated.  I provided 15 minutes of face-to-face time during this encounter.   [name], MD [facility]

## 2019-04-17 ENCOUNTER — Other Ambulatory Visit: Payer: Self-pay | Admitting: Family Medicine

## 2019-04-17 ENCOUNTER — Encounter: Payer: Self-pay | Admitting: Family Medicine

## 2019-04-17 DIAGNOSIS — F329 Major depressive disorder, single episode, unspecified: Secondary | ICD-10-CM

## 2019-04-17 DIAGNOSIS — F32A Depression, unspecified: Secondary | ICD-10-CM

## 2019-05-23 ENCOUNTER — Other Ambulatory Visit: Payer: Self-pay | Admitting: Family Medicine

## 2019-05-23 DIAGNOSIS — F329 Major depressive disorder, single episode, unspecified: Secondary | ICD-10-CM

## 2019-05-23 DIAGNOSIS — F32A Depression, unspecified: Secondary | ICD-10-CM

## 2019-06-25 ENCOUNTER — Other Ambulatory Visit: Payer: Self-pay | Admitting: Family Medicine

## 2019-06-25 DIAGNOSIS — F32A Depression, unspecified: Secondary | ICD-10-CM

## 2019-06-25 DIAGNOSIS — F329 Major depressive disorder, single episode, unspecified: Secondary | ICD-10-CM

## 2019-08-11 ENCOUNTER — Other Ambulatory Visit: Payer: Self-pay | Admitting: Family Medicine

## 2019-08-11 DIAGNOSIS — F32A Depression, unspecified: Secondary | ICD-10-CM

## 2020-12-28 ENCOUNTER — Ambulatory Visit (INDEPENDENT_AMBULATORY_CARE_PROVIDER_SITE_OTHER): Payer: 59

## 2020-12-28 ENCOUNTER — Ambulatory Visit (INDEPENDENT_AMBULATORY_CARE_PROVIDER_SITE_OTHER): Payer: Self-pay | Admitting: *Deleted

## 2020-12-28 ENCOUNTER — Other Ambulatory Visit: Payer: Self-pay

## 2020-12-28 VITALS — BP 130/84 | HR 87 | Wt 246.0 lb

## 2020-12-28 DIAGNOSIS — O3680X0 Pregnancy with inconclusive fetal viability, not applicable or unspecified: Secondary | ICD-10-CM | POA: Diagnosis not present

## 2020-12-28 LAB — BETA HCG QUANT (REF LAB): hCG Quant: 8500 m[IU]/mL

## 2020-12-28 LAB — POCT URINE PREGNANCY: Preg Test, Ur: POSITIVE — AB

## 2020-12-28 NOTE — Progress Notes (Signed)
New OB Intake  I explained I am completing New OB Intake today. We discussed her EDD of 07/19/2021 that is based on LMP of 10/12/2020. Pt is G3/P2. I reviewed her allergies, medications, Medical/Surgical/OB history, and appropriate screenings.   Pt had an ablation in 2020 and has not been on any birth control.  Pt has had regular cycles since the ablation. Her last cycle was 10/12/20. Pt denies any cramping or vaginal bleeding.   Pt first positive UPT was 9/25 and had 4 other positive UPT's.  UPT in office was positive.   Patient Active Problem List   Diagnosis Date Noted   Encounter for removal of intrauterine contraceptive device (IUD) 50/05/7046   Monoallelic mutation of CHEK2 gene    Genetic testing 09/05/2014   Family history of breast cancer    Family history of melanoma    Obesity 08/01/2014   H/O: C-section 10/19/2012    Concerns addressed today    Patient informed that the ultrasound is considered a limited obstetric ultrasound and is not intended to be a complete ultrasound exam.  Patient also informed that the ultrasound is not being completed with the intent of assessing for fetal or placental anomalies or any pelvic abnormalities. Explained that the purpose of today's ultrasound is to assess for dating and fetal heart rate.  Patient acknowledges the purpose of the exam and the limitations of the study.      Did patients viability scan and was only visualized a gestation sac. No fetal pole or embryo.   Had patient get an HCG drawn and will discuss with Dr Harolyn Rutherford and call patient back with next steps.   Pt verbalizes and understands.   Talked with Dr A once HCG results came back, will order formal scan.     Crosby Oyster, RN 12/28/2020  10:18 AM

## 2020-12-28 NOTE — Progress Notes (Signed)
Patient was assessed and managed by nursing staff during this encounter. I have reviewed the chart and agree with the documentation and plan. Will follow up ultrasound results and manage accordingly.  Verita Schneiders, MD 12/28/2020 1:10 PM

## 2020-12-31 ENCOUNTER — Other Ambulatory Visit: Payer: 59

## 2021-01-04 ENCOUNTER — Ambulatory Visit
Admission: RE | Admit: 2021-01-04 | Discharge: 2021-01-04 | Disposition: A | Discharge status: Home / Self Care | Payer: 59 | Source: Ambulatory Visit | Attending: Obstetrics & Gynecology | Admitting: Obstetrics & Gynecology

## 2021-01-04 ENCOUNTER — Encounter: Payer: Self-pay | Admitting: Obstetrics & Gynecology

## 2021-01-04 ENCOUNTER — Ambulatory Visit (INDEPENDENT_AMBULATORY_CARE_PROVIDER_SITE_OTHER): Payer: 59 | Admitting: Obstetrics & Gynecology

## 2021-01-04 ENCOUNTER — Other Ambulatory Visit: Payer: Self-pay

## 2021-01-04 ENCOUNTER — Other Ambulatory Visit: Payer: Self-pay | Admitting: Obstetrics & Gynecology

## 2021-01-04 VITALS — BP 123/65 | HR 71 | Wt 246.5 lb

## 2021-01-04 DIAGNOSIS — O3680X0 Pregnancy with inconclusive fetal viability, not applicable or unspecified: Secondary | ICD-10-CM | POA: Diagnosis not present

## 2021-01-04 DIAGNOSIS — F419 Anxiety disorder, unspecified: Secondary | ICD-10-CM

## 2021-01-04 NOTE — Progress Notes (Signed)
Patient ID: Kathleen Osborne, female   DOB: 09-04-1985, 35 y.o.   MRN: [id number] Ultrasounds Results Note  SUBJECTIVE HPI:  [name] is a 35 y.o. G3P2002 at 23w0dby LMP [facility] The patient notes vaginal bleedingBHCG was 8500.  Repeat ultrasound was performed earlier today.   Past Medical History:  Diagnosis Date   Anxiety    no meds while pregnant   Complication of anesthesia    very anxious on emergence   Depression    Family history of breast cancer    Family history of melanoma    Heartburn in pregnancy    Monoallelic mutation of CHEK2 gene    Past Surgical History:  Procedure Laterality Date   CERVICAL CONE BIOPSY     2006   CESAREAN SECTION     NRFHR 1st Csection.   CESAREAN SECTION N/A 10/24/2012   Procedure: REPEAT CESAREAN SECTION;  Surgeon: CShelly Bombard MD;  [facility];  Service: Obstetrics;  Laterality: N/A;   DILITATION & CURRETTAGE/HYSTROSCOPY WITH HYDROTHERMAL ABLATION N/A 03/06/2019   Procedure: DILATATION & CURETTAGE/HYSTEROSCOPY WITH HYDROTHERMAL ABLATION;  Surgeon: CMora Bellman MD;  [facility]  Service: Gynecology;  Laterality: N/A;   LEEP     TONSILLECTOMY AND ADENOIDECTOMY Bilateral 08/22/2017   Procedure: TONSILLECTOMY AND ADENOIDECTOMY;  Surgeon: TLeta Baptist MD;  [facility]  Service: ENT;  Laterality: Bilateral;   Social History   Socioeconomic History   Marital status: Married    Spouse name: Not on file   Number of children: 2   Years of education: Not on file   Highest education level: Not on file  Occupational History   Not on file  Tobacco Use   Smoking status: Former    Types: E-cigarettes    Quit date: 03/14/2010    Years since quitting: 10.8   Smokeless tobacco: Never  Substance and Sexual Activity   Alcohol use: Yes    Alcohol/week: 0.0 standard drinks    Comment: Occassionally   Drug use: No    Sexual activity: Yes    Partners: Male    Birth control/protection: None  Other Topics Concern   Not on file  Social History Narrative   Not on file   Social Determinants of Health   Financial Resource Strain: Not on file  Food Insecurity: Not on file  Transportation Needs: Not on file  Physical Activity: Not on file  Stress: Not on file  Social Connections: Not on file  Intimate Partner Violence: Not on file   Current Outpatient Medications on File Prior to Visit  Medication Sig Dispense Refill   Multiple Vitamin (MULTIVITAMIN) tablet Take 1 tablet by mouth daily.     No current facility-administered medications on file prior to visit.   No Known Allergies  I have reviewed patient's Past Medical Hx, Surgical Hx, Family Hx, Social Hx, medications and allergies.   Review of Systems Review of Systems  Constitutional: Negative for fever and chills.  Gastrointestinal: Negative for nausea, vomiting, abdominal pain, diarrhea and constipation.  Genitourinary: Negative for dysuria.  Musculoskeletal: Negative for back pain.  Neurological: Negative for dizziness and weakness.    Physical Exam  BP 123/65   Pulse 71   Wt 246 lb 8 oz (111.8 kg)   LMP 10/12/2020   BMI 46.58 kg/m   GENERAL: Well-developed, well-nourished female in no acute distress.  HEENT: Normocephalic, atraumatic.   LUNGS:  Effort normal ABDOMEN: soft, non-tender HEART: Regular rate  SKIN: Warm, dry and without erythema PSYCH: Normal mood and affect NEURO: Alert and oriented x 4  LAB RESULTS No results found for this or any previous visit (from the past 24 hour(s)).  IMAGING US OB LESS THAN 14 WEEKS WITH OB TRANSVAGINAL  Result Date: 01/04/2021 CLINICAL DATA:  Pregnant, encounter to assess fetal viability; LMP 10/12/2020; quantitative beta HCG 8,500 on 12/28/2020. Patient indicates started having spotting/bleeding on 01/01/2021 EXAM: OBSTETRIC <14 WK [place] AND TRANSVAGINAL OB US TECHNIQUE: Both  transabdominal and transvaginal ultrasound examinations were performed for complete evaluation of the gestation as well as the maternal uterus, adnexal regions, and pelvic cul-de-sac. Transvaginal technique was performed to assess early pregnancy. COMPARISON:  12/28/2020 FINDINGS: Intrauterine gestational sac: Present, single, irregular Yolk sac:  Not identified Embryo:  Not identified Cardiac Activity: N/A Heart Rate: N/A  bpm MSD: 15.5 mm   6 w   2 d CRL:    mm    w    d                  [place] EDC: Subchorionic hemorrhage:  None visualized. Maternal uterus/adnexae: Uterus anteverted, otherwise unremarkable. Trace free pelvic fluid. Ovaries unremarkable. No adnexal masses. IMPRESSION: Gestational sac within uterus without visualization of a yolk sac or fetal pole. Findings are suspicious but not yet definitive for failed pregnancy. Recommend follow-up US in 10-14 days for definitive diagnosis. This recommendation follows SRU consensus guidelines: Diagnostic Criteria for Nonviable Pregnancy Early in the First Trimester. [place] Med 2013; 952:8413-24. Electronically Signed   By: Lavonia Dana M.D.   On: 01/04/2021 09:45    ASSESSMENT 1. Anxiety   2. Encounter to determine fetal viability of pregnancy, single or unspecified fetus     PLAN Discharge home in stable condition HCG repeated, suspicious for failed pregnancy   Woodroe Mode, MD  01/04/2021  10:57 AM

## 2021-01-04 NOTE — Progress Notes (Signed)
GAD-7 is positive today. Pt reports ongoing history of anxiety; affects her most days. Reports she believes this is due to ongoing, untreated ADHD. Pt is not currently following with anyone for mental health. States she did not have insurance until this summer. Explained I can refer patient to [facility] for further evaluation and recommendations. Pt agreeable to virtual appt.   Apolonio Schneiders RN 01/04/21

## 2021-01-05 LAB — BETA HCG QUANT (REF LAB): hCG Quant: 5572 m[IU]/mL

## 2021-01-06 ENCOUNTER — Telehealth: Payer: Self-pay | Admitting: *Deleted

## 2021-01-06 NOTE — Telephone Encounter (Signed)
I called Sallee and reached her voicemail and left a message I am calling with information from your provider and to make an appointment. Please call our office back. Jevonte Clanton,RN

## 2021-01-06 NOTE — Telephone Encounter (Signed)
-----   Message from Woodroe Mode, MD sent at 01/05/2021  3:41 PM EDT ----- Result consistent with failed pregnancy, she will need f/u within a week

## 2021-01-07 ENCOUNTER — Telehealth: Payer: Self-pay | Admitting: Clinical

## 2021-01-07 ENCOUNTER — Other Ambulatory Visit: Payer: Self-pay | Admitting: *Deleted

## 2021-01-07 ENCOUNTER — Telehealth: Payer: Self-pay

## 2021-01-07 ENCOUNTER — Telehealth: Payer: Self-pay | Admitting: *Deleted

## 2021-01-07 DIAGNOSIS — O039 Complete or unspecified spontaneous abortion without complication: Secondary | ICD-10-CM

## 2021-01-07 NOTE — Telephone Encounter (Signed)
Left HIPPA-compliant message to call back Jamie from [facility] at  [phone] (Jamie's office).   

## 2021-01-07 NOTE — Telephone Encounter (Signed)
I called Shalanda again and left a message I am calling her with important information from your provider and since I have not reached you by phone I will send a detailed MyChart message- please contact us if you have questions.  Deloros Beretta,RN

## 2021-01-07 NOTE — Telephone Encounter (Signed)
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 Per chart no pending appointments today. There is a cancelled mfm appointment.  Brisia Schuermann,RN

## 2021-01-07 NOTE — Telephone Encounter (Signed)
Return call to pt regarding vm pt c/o pain requesting Rx for pain Pt was not ava I left a detailed vm for pt.

## 2021-01-08 ENCOUNTER — Telehealth: Payer: Self-pay

## 2021-01-08 NOTE — Telephone Encounter (Signed)
TC to pt again regarding vm and see how pt is doing. No answer lvm for pt to call the office.

## 2021-01-11 ENCOUNTER — Other Ambulatory Visit: Payer: 59

## 2021-02-26 ENCOUNTER — Other Ambulatory Visit: Payer: Self-pay

## 2021-02-26 ENCOUNTER — Emergency Department (HOSPITAL_BASED_OUTPATIENT_CLINIC_OR_DEPARTMENT_OTHER): Payer: 59

## 2021-02-26 ENCOUNTER — Emergency Department (HOSPITAL_COMMUNITY): Payer: 59 | Admitting: Anesthesiology

## 2021-02-26 ENCOUNTER — Ambulatory Visit (HOSPITAL_BASED_OUTPATIENT_CLINIC_OR_DEPARTMENT_OTHER)
Admission: EM | Admit: 2021-02-26 | Discharge: 2021-02-27 | Disposition: A | Discharge status: Home / Self Care | Payer: 59 | Attending: Obstetrics & Gynecology | Admitting: Obstetrics & Gynecology

## 2021-02-26 ENCOUNTER — Encounter (HOSPITAL_COMMUNITY)
Admission: EM | Disposition: A | Discharge status: Home / Self Care | Payer: Self-pay | Source: Home / Self Care | Attending: Emergency Medicine

## 2021-02-26 ENCOUNTER — Inpatient Hospital Stay: Admit: 2021-02-26 | Payer: 59 | Admitting: Obstetrics & Gynecology

## 2021-02-26 ENCOUNTER — Telehealth: Payer: 59 | Admitting: Family

## 2021-02-26 ENCOUNTER — Ambulatory Visit: Admit: 2021-02-26 | Payer: 59

## 2021-02-26 ENCOUNTER — Encounter (HOSPITAL_BASED_OUTPATIENT_CLINIC_OR_DEPARTMENT_OTHER): Payer: Self-pay

## 2021-02-26 DIAGNOSIS — Z98891 History of uterine scar from previous surgery: Secondary | ICD-10-CM | POA: Insufficient documentation

## 2021-02-26 DIAGNOSIS — R102 Pelvic and perineal pain: Secondary | ICD-10-CM | POA: Diagnosis not present

## 2021-02-26 DIAGNOSIS — Z6841 Body Mass Index (BMI) 40.0 and over, adult: Secondary | ICD-10-CM | POA: Diagnosis not present

## 2021-02-26 DIAGNOSIS — Z87891 Personal history of nicotine dependence: Secondary | ICD-10-CM | POA: Insufficient documentation

## 2021-02-26 DIAGNOSIS — Z20822 Contact with and (suspected) exposure to covid-19: Secondary | ICD-10-CM | POA: Diagnosis not present

## 2021-02-26 DIAGNOSIS — O009 Unspecified ectopic pregnancy without intrauterine pregnancy: Secondary | ICD-10-CM

## 2021-02-26 DIAGNOSIS — O00101 Right tubal pregnancy without intrauterine pregnancy: Secondary | ICD-10-CM

## 2021-02-26 DIAGNOSIS — Z302 Encounter for sterilization: Secondary | ICD-10-CM | POA: Diagnosis not present

## 2021-02-26 DIAGNOSIS — R109 Unspecified abdominal pain: Secondary | ICD-10-CM

## 2021-02-26 HISTORY — PX: LAPAROSCOPIC BILATERAL SALPINGECTOMY: SHX5889

## 2021-02-26 LAB — COMPREHENSIVE METABOLIC PANEL
ALT: 20 U/L (ref 0–44)
AST: 19 U/L (ref 15–41)
Albumin: 4.1 g/dL (ref 3.5–5.0)
Alkaline Phosphatase: 54 U/L (ref 38–126)
Anion gap: 9 (ref 5–15)
BUN: 9 mg/dL (ref 6–20)
CO2: 23 mmol/L (ref 22–32)
Calcium: 9.4 mg/dL (ref 8.9–10.3)
Chloride: 105 mmol/L (ref 98–111)
Creatinine, Ser: 0.63 mg/dL (ref 0.44–1.00)
GFR, Estimated: >60 mL/min (ref >60)
Glucose, Bld: 96 mg/dL (ref 70–99)
Potassium: 4.3 mmol/L (ref 3.5–5.1)
Sodium: 137 mmol/L (ref 135–145)
Total Bilirubin: 0.4 mg/dL (ref 0.3–1.2)
Total Protein: 6.8 g/dL (ref 6.5–8.1)

## 2021-02-26 LAB — WET PREP, GENITAL
Clue Cells Wet Prep HPF POC: NONE SEEN
Sperm: NONE SEEN
Trich, Wet Prep: NONE SEEN
WBC, Wet Prep HPF POC: <10 (ref <10)
Yeast Wet Prep HPF POC: NONE SEEN

## 2021-02-26 LAB — URINALYSIS, ROUTINE W REFLEX MICROSCOPIC
Bilirubin Urine: NEGATIVE
Glucose, UA: NEGATIVE mg/dL
Ketones, ur: NEGATIVE mg/dL
Leukocytes,Ua: NEGATIVE
Nitrite: NEGATIVE
Protein, ur: NEGATIVE mg/dL
Specific Gravity, Urine: 1.015 (ref 1.005–1.030)
pH: 7 (ref 5.0–8.0)

## 2021-02-26 LAB — CBC
HCT: 37 % (ref 36.0–46.0)
Hemoglobin: 12 g/dL (ref 12.0–15.0)
MCH: 26.5 pg (ref 26.0–34.0)
MCHC: 32.4 g/dL (ref 30.0–36.0)
MCV: 81.9 fL (ref 80.0–100.0)
Platelets: 395 10*3/uL (ref 150–400)
RBC: 4.52 MIL/uL (ref 3.87–5.11)
RDW: 13.9 % (ref 11.5–15.5)
WBC: 13.5 10*3/uL — ABNORMAL HIGH (ref 4.0–10.5)
nRBC: 0 % (ref 0.0–0.2)

## 2021-02-26 LAB — RESP PANEL BY RT-PCR (FLU A&B, COVID) ARPGX2
Influenza A by PCR: NEGATIVE
Influenza B by PCR: NEGATIVE
SARS Coronavirus 2 by RT PCR: NEGATIVE

## 2021-02-26 LAB — LIPASE, BLOOD: Lipase: 45 U/L (ref 11–51)

## 2021-02-26 LAB — TYPE AND SCREEN
ABO/RH(D): A POS
Antibody Screen: NEGATIVE

## 2021-02-26 LAB — HCG, QUANTITATIVE, PREGNANCY: hCG, Beta Chain, Quant, S: 3184 m[IU]/mL — ABNORMAL HIGH (ref <5)

## 2021-02-26 LAB — PREGNANCY, URINE: Preg Test, Ur: POSITIVE — AB

## 2021-02-26 SURGERY — SALPINGECTOMY, BILATERAL, LAPAROSCOPIC
Anesthesia: General | Laterality: Bilateral

## 2021-02-26 MED ORDER — ONDANSETRON HCL 4 MG/2ML IJ SOLN
4.0000 mg | Freq: Once | INTRAMUSCULAR | Status: AC
  Administered 2021-02-26: 4 mg via INTRAVENOUS
  Filled 2021-02-26: qty 2

## 2021-02-26 MED ORDER — SUGAMMADEX SODIUM 200 MG/2ML IV SOLN
INTRAVENOUS | Status: DC | PRN
  Administered 2021-02-26: 400 mg via INTRAVENOUS

## 2021-02-26 MED ORDER — MORPHINE SULFATE (PF) 4 MG/ML IV SOLN
4.0000 mg | Freq: Once | INTRAVENOUS | Status: AC
  Administered 2021-02-26: 4 mg via INTRAVENOUS
  Filled 2021-02-26: qty 1

## 2021-02-26 MED ORDER — ACETAMINOPHEN 325 MG PO TABS: 650.0000 mg | ORAL_TABLET | Freq: Four times a day (QID) | ORAL | Status: DC | PRN

## 2021-02-26 MED ORDER — ONDANSETRON HCL 4 MG/2ML IJ SOLN
INTRAMUSCULAR | Status: DC | PRN
  Administered 2021-02-26: 4 mg via INTRAVENOUS

## 2021-02-26 MED ORDER — MIDAZOLAM HCL 2 MG/2ML IJ SOLN
INTRAMUSCULAR | Status: AC
  Filled 2021-02-26: qty 2

## 2021-02-26 MED ORDER — SODIUM CHLORIDE (PF) 0.9 % IJ SOLN
INTRAMUSCULAR | Status: DC | PRN
  Administered 2021-02-26: 10 mL

## 2021-02-26 MED ORDER — DEXMEDETOMIDINE (PRECEDEX) IN NS 20 MCG/5ML (4 MCG/ML) IV SYRINGE
PREFILLED_SYRINGE | INTRAVENOUS | Status: DC | PRN
  Administered 2021-02-26: 8 ug via INTRAVENOUS

## 2021-02-26 MED ORDER — DOCUSATE SODIUM 100 MG PO CAPS: 100.0000 mg | ORAL_CAPSULE | Freq: Two times a day (BID) | ORAL | 0 refills | Status: AC

## 2021-02-26 MED ORDER — OXYCODONE HCL 5 MG/5ML PO SOLN: 5.0000 mg | Freq: Once | ORAL | Status: AC | PRN

## 2021-02-26 MED ORDER — PROMETHAZINE HCL 25 MG/ML IJ SOLN
6.2500 mg | INTRAMUSCULAR | Status: DC | PRN
Start: 2021-02-26 — End: 2021-02-27
  Filled 2021-02-26: qty 1

## 2021-02-26 MED ORDER — ONDANSETRON HCL 4 MG/2ML IJ SOLN
INTRAMUSCULAR | Status: AC
  Filled 2021-02-26: qty 2

## 2021-02-26 MED ORDER — LACTATED RINGERS IV SOLN: INTRAVENOUS | Status: DC | PRN

## 2021-02-26 MED ORDER — ACETAMINOPHEN 10 MG/ML IV SOLN
1000.0000 mg | Freq: Once | INTRAVENOUS | Status: DC | PRN
  Administered 2021-02-27: 1000 mg via INTRAVENOUS
  Filled 2021-02-26: qty 100

## 2021-02-26 MED ORDER — SUCCINYLCHOLINE CHLORIDE 200 MG/10ML IV SOSY
PREFILLED_SYRINGE | INTRAVENOUS | Status: DC | PRN
  Administered 2021-02-26: 100 mg via INTRAVENOUS

## 2021-02-26 MED ORDER — OXYCODONE HCL 5 MG PO CAPS: 5.0000 mg | ORAL_CAPSULE | Freq: Four times a day (QID) | ORAL | 0 refills | Status: AC | PRN

## 2021-02-26 MED ORDER — OXYCODONE HCL 5 MG PO TABS
5.0000 mg | ORAL_TABLET | Freq: Once | ORAL | Status: AC | PRN
  Administered 2021-02-27: 5 mg via ORAL

## 2021-02-26 MED ORDER — SCOPOLAMINE 1 MG/3DAYS TD PT72
MEDICATED_PATCH | TRANSDERMAL | Status: AC
  Filled 2021-02-26: qty 1

## 2021-02-26 MED ORDER — DEXAMETHASONE SODIUM PHOSPHATE 10 MG/ML IJ SOLN
INTRAMUSCULAR | Status: AC
  Filled 2021-02-26: qty 1

## 2021-02-26 MED ORDER — ARTIFICIAL TEARS OPHTHALMIC OINT
TOPICAL_OINTMENT | OPHTHALMIC | Status: AC
  Filled 2021-02-26: qty 3.5

## 2021-02-26 MED ORDER — FENTANYL CITRATE (PF) 100 MCG/2ML IJ SOLN
25.0000 ug | INTRAMUSCULAR | Status: DC | PRN
Start: 2021-02-26 — End: 2021-02-27
  Administered 2021-02-26: 150 ug via INTRAVENOUS
  Administered 2021-02-26 (×2): 50 ug via INTRAVENOUS

## 2021-02-26 MED ORDER — FENTANYL CITRATE (PF) 250 MCG/5ML IJ SOLN
INTRAMUSCULAR | Status: AC
  Filled 2021-02-26: qty 5

## 2021-02-26 MED ORDER — SODIUM CHLORIDE 0.9 % IR SOLN
Status: DC | PRN
  Administered 2021-02-26: 3000 mL

## 2021-02-26 MED ORDER — AMISULPRIDE (ANTIEMETIC) 5 MG/2ML IV SOLN
10.0000 mg | Freq: Once | INTRAVENOUS | Status: AC | PRN
  Administered 2021-02-27: 10 mg via INTRAVENOUS
  Filled 2021-02-26 (×2): qty 4

## 2021-02-26 MED ORDER — IBUPROFEN 800 MG PO TABS: 800.0000 mg | ORAL_TABLET | Freq: Three times a day (TID) | ORAL | 0 refills | Status: DC | PRN

## 2021-02-26 MED ORDER — SUCCINYLCHOLINE CHLORIDE 200 MG/10ML IV SOSY
PREFILLED_SYRINGE | INTRAVENOUS | Status: AC
  Filled 2021-02-26: qty 10

## 2021-02-26 MED ORDER — ROCURONIUM BROMIDE 10 MG/ML (PF) SYRINGE
PREFILLED_SYRINGE | INTRAVENOUS | Status: DC | PRN
  Administered 2021-02-26: 40 mg via INTRAVENOUS
  Administered 2021-02-26: 20 mg via INTRAVENOUS

## 2021-02-26 MED ORDER — LIDOCAINE 2% (20 MG/ML) 5 ML SYRINGE
INTRAMUSCULAR | Status: AC
  Filled 2021-02-26: qty 5

## 2021-02-26 MED ORDER — BUPIVACAINE HCL (PF) 0.25 % IJ SOLN
INTRAMUSCULAR | Status: DC | PRN
  Administered 2021-02-26: 30 mL

## 2021-02-26 MED ORDER — MIDAZOLAM HCL 2 MG/2ML IJ SOLN
INTRAMUSCULAR | Status: DC | PRN
  Administered 2021-02-26: 2 mg via INTRAVENOUS

## 2021-02-26 MED ORDER — PHENYLEPHRINE 40 MCG/ML (10ML) SYRINGE FOR IV PUSH (FOR BLOOD PRESSURE SUPPORT)
PREFILLED_SYRINGE | INTRAVENOUS | Status: DC | PRN
  Administered 2021-02-26: 80 ug via INTRAVENOUS
  Administered 2021-02-26: 120 ug via INTRAVENOUS

## 2021-02-26 MED ORDER — SCOPOLAMINE 1 MG/3DAYS TD PT72
MEDICATED_PATCH | TRANSDERMAL | Status: DC | PRN
  Administered 2021-02-26: 1 via TRANSDERMAL

## 2021-02-26 MED ORDER — SODIUM CHLORIDE 0.9 % IV BOLUS
1000.0000 mL | Freq: Once | INTRAVENOUS | Status: AC
  Administered 2021-02-26: 1000 mL via INTRAVENOUS

## 2021-02-26 MED ORDER — KETOROLAC TROMETHAMINE 30 MG/ML IJ SOLN: 30.0000 mg | Freq: Once | INTRAMUSCULAR | Status: DC

## 2021-02-26 MED ORDER — PROPOFOL 10 MG/ML IV BOLUS
INTRAVENOUS | Status: DC | PRN
  Administered 2021-02-26: 200 mg via INTRAVENOUS

## 2021-02-26 MED ORDER — LIDOCAINE 2% (20 MG/ML) 5 ML SYRINGE
INTRAMUSCULAR | Status: DC | PRN
  Administered 2021-02-26: 60 mg via INTRAVENOUS

## 2021-02-26 MED ORDER — SUCCINYLCHOLINE CHLORIDE 200 MG/10ML IV SOSY: PREFILLED_SYRINGE | INTRAVENOUS | Status: DC | PRN

## 2021-02-26 MED ORDER — DEXAMETHASONE SODIUM PHOSPHATE 10 MG/ML IJ SOLN
INTRAMUSCULAR | Status: DC | PRN
  Administered 2021-02-26: 10 mg via INTRAVENOUS

## 2021-02-26 MED ORDER — PROPOFOL 10 MG/ML IV BOLUS
INTRAVENOUS | Status: AC
  Filled 2021-02-26: qty 20

## 2021-02-26 SURGICAL SUPPLY — 37 items
ADH SKN CLS APL DERMABOND .7 (GAUZE/BANDAGES/DRESSINGS) ×2
APL SRG 38 LTWT LNG FL B (MISCELLANEOUS) ×2
APPLICATOR ARISTA FLEXITIP XL (MISCELLANEOUS) ×3 IMPLANT
BAG SPEC RTRVL LRG 6X4 10 (ENDOMECHANICALS) ×2
DERMABOND ADVANCED (GAUZE/BANDAGES/DRESSINGS) ×2
DERMABOND ADVANCED .7 DNX12 (GAUZE/BANDAGES/DRESSINGS) ×2 IMPLANT
DRSG OPSITE POSTOP 3X4 (GAUZE/BANDAGES/DRESSINGS) ×3 IMPLANT
DURAPREP 26ML APPLICATOR (WOUND CARE) ×4 IMPLANT
GLOVE SURG LTX SZ6.5 (GLOVE) ×4 IMPLANT
GLOVE SURG UNDER POLY LF SZ7 (GLOVE) ×16 IMPLANT
GOWN STRL REUS W/ TWL LRG LVL3 (GOWN DISPOSABLE) ×4 IMPLANT
GOWN STRL REUS W/TWL LRG LVL3 (GOWN DISPOSABLE) ×8
HEMOSTAT ARISTA ABSORB 3G PWDR (HEMOSTASIS) ×3 IMPLANT
IRRIGATION STRYKERFLOW (MISCELLANEOUS) ×1 IMPLANT
IRRIGATOR STRYKERFLOW (MISCELLANEOUS) ×4
KIT TURNOVER KIT B (KITS) ×4 IMPLANT
LIGASURE VESSEL 5MM BLUNT TIP (ELECTROSURGICAL) IMPLANT
NDL INSUFFLATION 14GA 120MM (NEEDLE) ×1 IMPLANT
NEEDLE INSUFFLATION 14GA 120MM (NEEDLE) ×4 IMPLANT
PACK LAPAROSCOPY BASIN (CUSTOM PROCEDURE TRAY) ×4 IMPLANT
PACK TRENDGUARD 450 HYBRID PRO (MISCELLANEOUS) ×1 IMPLANT
PAD OB MATERNITY 4.3X12.25 (PERSONAL CARE ITEMS) ×4 IMPLANT
POUCH SPECIMEN RETRIEVAL 10MM (ENDOMECHANICALS) ×3 IMPLANT
PROTECTOR NERVE ULNAR (MISCELLANEOUS) ×8 IMPLANT
SET TUBE SMOKE EVAC HIGH FLOW (TUBING) ×4 IMPLANT
SHEARS HARMONIC ACE PLUS 36CM (ENDOMECHANICALS) ×3 IMPLANT
SLEEVE ENDOPATH XCEL 5M (ENDOMECHANICALS) ×4 IMPLANT
SOLUTION ELECTROLUBE (MISCELLANEOUS) ×4 IMPLANT
SUT MON AB 4-0 PS1 27 (SUTURE) ×4 IMPLANT
SUT VICRYL 0 UR6 27IN ABS (SUTURE) ×3 IMPLANT
SYSTEM CARTER THOMASON II (TROCAR) ×3 IMPLANT
TOWEL GREEN STERILE FF (TOWEL DISPOSABLE) ×8 IMPLANT
TRAY FOLEY W/BAG SLVR 14FR (SET/KITS/TRAYS/PACK) ×4 IMPLANT
TRENDGUARD 450 HYBRID PRO PACK (MISCELLANEOUS) ×4
TROCAR XCEL NON-BLD 11X100MML (ENDOMECHANICALS) ×4 IMPLANT
TROCAR XCEL NON-BLD 5MMX100MML (ENDOMECHANICALS) ×4 IMPLANT
WARMER LAPAROSCOPE (MISCELLANEOUS) ×4 IMPLANT

## 2021-02-26 NOTE — Discharge Instructions (Addendum)
HOME INSTRUCTIONS  Please note any unusual or excessive bleeding, pain, swelling. Mild dizziness or drowsiness are normal for about 24 hours after surgery.   Shower when comfortable  Restrictions: No driving for 24 hours or while taking pain medications.  Activity:  No heavy lifting (> 10 lbs), nothing in vagina (no tampons, douching, or intercourse) x 4 weeks; no tub baths for 4 weeks Vaginal spotting is expected but if your bleeding is heavy, period like,  please call the office   Incision: Please take the "honecomb" bandage off in 48 hours.  All incision are covered with dermabond, which is like skin super glue.  It will fall off when they are ready to; you may clean your incision with mild soap and water but do not rub or scrub the incision site.  You may experience slight bloody drainage from your incision periodically.  This is normal.  If you experience a large amount of drainage or the incision opens, please call your physician who will likely direct you to the emergency department.  Diet:  You may return to your regular diet.  Do not eat large meals.  Eat small frequent meals throughout the day.  Continue to drink a good amount of water at least 6-8 glasses of water per day, hydration is very important for the healing process.  Pain Management: Please take round the clock ibuprofen every 8 hours with food for the next few days.  Alternate this with oxycodone and over the counter tylenol.  Oxycodone may cause constipation so be sure to take a stool softener while taking this medication.  Always take prescription pain medication with food, it may cause constipation, increase fluids and fiber and you may want to take an over-the-counter stool softener like Colace as needed up to 2x a day.    Alcohol -- Avoid for 24 hours and while taking pain medications.  Nausea: Take sips of ginger ale or soda.  For severe nausea, a prescription for zofran has also been sent in to take if  needed.  Fever -- Call physician if temperature over 101 degrees  Follow up:  Please call either [facility] [place] or your prior OB/GYN to schedule a 1-2 week postop visit.  If you experience fever (a temperature greater than 100.4), pain unrelieved by pain medication, shortness of breath, swelling of a single leg, or any other symptoms which are concerning to you please the office immediately.

## 2021-02-26 NOTE — ED Notes (Signed)
Patient transported to Ultrasound 

## 2021-02-26 NOTE — Anesthesia Preprocedure Evaluation (Addendum)
Anesthesia Evaluation  Patient identified by MRN, date of birth, ID band Patient awake    Reviewed: Allergy & Precautions, NPO status , Patient's Chart, lab work & pertinent test results  Airway Mallampati: III  TM Distance: >3 FB Neck ROM: Full    Dental no notable dental hx.    Pulmonary former smoker,    Pulmonary exam normal breath sounds clear to auscultation       Cardiovascular negative cardio ROS Normal cardiovascular exam Rhythm:Regular Rate:Normal     Neuro/Psych PSYCHIATRIC DISORDERS Anxiety Depression negative neurological ROS     GI/Hepatic negative GI ROS, Neg liver ROS,   Endo/Other  Morbid obesity  Renal/GU negative Renal ROS     Musculoskeletal negative musculoskeletal ROS (+)   Abdominal (+) + obese,   Peds  Hematology negative hematology ROS (+)   Anesthesia Other Findings RUPTURED ECTOPIC PREGNANCY  Reproductive/Obstetrics (+) Pregnancy                            Anesthesia Physical Anesthesia Plan  ASA: 3 and emergent  Anesthesia Plan: General   Post-op Pain Management:    Induction: Intravenous and Rapid sequence  PONV Risk Score and Plan: 4 or greater and Ondansetron, Dexamethasone, Midazolam, Scopolamine patch - Pre-op and Treatment may vary due to age or medical condition  Airway Management Planned: Oral ETT  Additional Equipment:   Intra-op Plan:   Post-operative Plan: Extubation in OR  Informed Consent: I have reviewed the patients History and Physical, chart, labs and discussed the procedure including the risks, benefits and alternatives for the proposed anesthesia with the patient or authorized representative who has indicated his/her understanding and acceptance.     Dental advisory given  Plan Discussed with: CRNA  Anesthesia Plan Comments:        Anesthesia Quick Evaluation

## 2021-02-26 NOTE — Progress Notes (Signed)
Based on what you shared with me, I feel your condition warrants further evaluation and I recommend that you be seen in a face to face visit.  Given your symptoms you need to be seen in person to be evaluated in person.    NOTE: There will be NO CHARGE for this eVisit   If you are having a true medical emergency please call 911.      For an urgent face to face visit, [facility] has six urgent care centers for your convenience:     [facility] [place] Get Driving Directions [phone] [place], [place] [address]    [facility]) Get Driving Directions [phone] [place], [place] [address]  [facility]) Get Driving Directions [phone] [address] [place],  [place]  [address]  [facility]  Get Driving Directions [phone] [address] [place], [place], [place] [address]   [facility] [place] Get Driving Directions  [phone] [address] [place], [place] [address]   [facility] Get Driving Directions [phone] [address] [place], [place] [address]  Your MyChart E-visit questionnaire answers were reviewed by a board certified advanced clinical practitioner to complete your personal care plan based on your specific symptoms.  Thank you for using e-Visits.

## 2021-02-26 NOTE — ED Triage Notes (Signed)
Patient here POV from Home with ABD Pain.  Patient states she has been having RLQ ABD Pain approximately 8 days PTA along with Nausea/Emesis.   Patient states she felt better shortly after but today Pain returned stronger. No Urinary Symptoms. Miscarriage approximately 1 month PTA.   NAD Noted during Triage. A&Ox4. GCS 15. Ambulatory. OTC Medication not effective in treating Pain.

## 2021-02-26 NOTE — ED Notes (Signed)
Pelvic exam done by Dr. Gilford Raid and Lavella Lemons - EMT assisted.

## 2021-02-26 NOTE — Anesthesia Procedure Notes (Signed)
Procedure Name: Intubation Date/Time: 02/26/2021 10:31 PM Performed by: Alain Marion, CRNA Pre-anesthesia Checklist: Patient identified, Emergency Drugs available, Suction available and Patient being monitored Patient Re-evaluated:Patient Re-evaluated prior to induction Oxygen Delivery Method: Circle System Utilized Preoxygenation: Pre-oxygenation with 100% oxygen Induction Type: IV induction and Rapid sequence Laryngoscope Size: Miller and 2 Grade View: Grade I Tube type: Oral Tube size: 7.0 mm Number of attempts: 1 Airway Equipment and Method: Stylet and Oral airway Placement Confirmation: ETT inserted through vocal cords under direct vision, positive ETCO2 and breath sounds checked- equal and bilateral Secured at: 21 cm Tube secured with: Tape Dental Injury: Teeth and Oropharynx as per pre-operative assessment

## 2021-02-26 NOTE — MAU Note (Signed)
Dr. Gwenlyn Perking and Dr. Nelda Marseille at bedside.

## 2021-02-26 NOTE — H&P (Signed)
[facility]s and Gynecology Attending History and Physical  Kathleen Osborne is a 35 y.o. G3P2002 transferred to our service due to ruptured ectopic pregnancy.    Recently had SAB in October.  She had not yet had a normal period following her miscarriage.  Mostly starting today she noted intense RLQ pain. Tried OTC medication with minimal improvement.  Currently rates her pain 5/10.  Last meal around 1:30pm.  Denies any abnormal vaginal discharge, fevers, chills, sweats, dysuria, nausea, vomiting, other GI or GU symptoms or other general symptoms.  Of note, pt struggles with her periods and does not want any future pregnancies.  Prior ablation completed due to AUB.  Past Medical History:  Diagnosis Date   Anxiety    no meds while pregnant   Complication of anesthesia    very anxious on emergence   Depression    Family history of breast cancer    Family history of melanoma    Heartburn in pregnancy    Monoallelic mutation of CHEK2 gene    Past Surgical History:  Procedure Laterality Date   CERVICAL CONE BIOPSY     2006   CESAREAN SECTION     NRFHR 1st Csection.   CESAREAN SECTION N/A 10/24/2012   Procedure: REPEAT CESAREAN SECTION;  Surgeon: Shelly Bombard, MD;  Location: [facility];  Service: Obstetrics;  Laterality: N/A;   DILITATION & CURRETTAGE/HYSTROSCOPY WITH HYDROTHERMAL ABLATION N/A 03/06/2019   Procedure: DILATATION & CURETTAGE/HYSTEROSCOPY WITH HYDROTHERMAL ABLATION;  Surgeon: [name], MD;  Location: [facility];  Service: Gynecology;  Laterality: N/A;   LEEP     TONSILLECTOMY AND ADENOIDECTOMY Bilateral 08/22/2017   Procedure: TONSILLECTOMY AND ADENOIDECTOMY;  Surgeon: Leta Baptist, MD;  Location: [facility];  Service: ENT;  Laterality: Bilateral;   OB History  Gravida Para Term Preterm AB Living  _0 SAB IAB Ectopic Multiple Live Births          2    # Outcome Date GA Lbr Len/2nd Weight Sex Delivery Anes PTL Lv   3 Gravida           2 Term 10/24/12 [redacted]w[redacted]d 3245 g F CS-Vac Spinal  LIV  1 Term 12/19/10 [id number] g M CS-LTranv   LIV     Birth Comments: No complications.  Failed induction  Patient denies any other pertinent gynecologic issues.  No current facility-administered medications on file prior to encounter.   Current Outpatient Medications on File Prior to Encounter  Medication Sig Dispense Refill   Multiple Vitamin (MULTIVITAMIN) tablet Take 1 tablet by mouth daily.     No Known Allergies  Social History:   reports that she quit smoking about 10 years ago. Her smoking use included e-cigarettes. She has never used smokeless tobacco. She reports current alcohol use. She reports that she does not use drugs. Family History  Problem Relation Age of Onset   Melanoma Mother 2567 Early death Mother    Hyperlipidemia Father    Breast cancer Maternal Grandmother        dx in her 6035s Parkinson's disease Maternal Grandfather    Depression Maternal Grandfather    Drug abuse Maternal Grandfather    Mental illness Maternal Grandfather    Prostate cancer Maternal Grandfather    Colon cancer Maternal Grandfather    Breast cancer Maternal Aunt 33   Prostate cancer Paternal Grandfather    Melanoma Sister [id number]  Mental illness Maternal Aunt     Review of Systems: Pertinent items noted in HPI and remainder of comprehensive ROS otherwise negative.  PHYSICAL EXAM: BP (!) 126/56 (BP Location: Left Arm)    Pulse 83    Temp 98.4 F (36.9 C) (Oral)    Resp 18    Ht 5\' 1"  (1.549 m)    Wt 111.8 kg    LMP 02/07/2021    SpO2 100%    Breastfeeding Unknown    BMI 46.57 kg/m   CONSTITUTIONAL: Well-developed, well-nourished female in no acute distress.  SKIN: Skin is warm and dry. No rash noted. Not diaphoretic. No erythema. No pallor. NEUROLOGIC: Alert and oriented to person, place, and time. Normal reflexes, muscle tone coordination. No cranial nerve deficit noted. PSYCHIATRIC: Normal mood and affect.  Normal behavior. Normal judgment and thought content. CARDIOVASCULAR: Normal heart rate noted, regular rhythm RESPIRATORY: Effort and breath sounds normal, no problems with respiration noted ABDOMEN: obese, +RLQ tender, no rebound or guarding PELVIC: deferred MUSCULOSKELETAL: no calf tenderness bilaterally EXT: no edema bilaterally, normal pulses  Labs: Results for orders placed or performed during the hospital encounter of 02/26/21 (from the past 336 hour(s))  Lipase, blood   Collection Time: 02/26/21  4:25 PM  Result Value Ref Range   Lipase 45 11 - 51 U/L  Comprehensive metabolic panel   Collection Time: 02/26/21  4:25 PM  Result Value Ref Range   Sodium 137 135 - 145 mmol/L   Potassium 4.3 3.5 - 5.1 mmol/L   Chloride 105 98 - 111 mmol/L   CO2 23 22 - 32 mmol/L   Glucose, Bld 96 70 - 99 mg/dL   BUN 9 6 - 20 mg/dL   Creatinine, Ser 02/28/21 0.44 - 1.00 mg/dL   Calcium 9.4 8.9 - 8.21 mg/dL   Total Protein 6.8 6.5 - 8.1 g/dL   Albumin 4.1 3.5 - 5.0 g/dL   AST 19 15 - 41 U/L   ALT 20 0 - 44 U/L   Alkaline Phosphatase 54 38 - 126 U/L   Total Bilirubin 0.4 0.3 - 1.2 mg/dL   GFR, Estimated 07.4 >46 mL/min   Anion gap 9 5 - 15  CBC   Collection Time: 02/26/21  4:25 PM  Result Value Ref Range   WBC 13.5 (H) 4.0 - 10.5 K/uL   RBC 4.52 3.87 - 5.11 MIL/uL   Hemoglobin 12.0 12.0 - 15.0 g/dL   HCT 02/28/21 08.8 - 74.9 %   MCV 81.9 80.0 - 100.0 fL   MCH 26.5 26.0 - 34.0 pg   MCHC 32.4 30.0 - 36.0 g/dL   RDW 61.1 85.7 - 45.7 %   Platelets 395 150 - 400 K/uL   nRBC 0.0 0.0 - 0.2 %  Urinalysis, Routine w reflex microscopic Urine, Clean Catch   Collection Time: 02/26/21  4:25 PM  Result Value Ref Range   Color, Urine YELLOW YELLOW   APPearance CLEAR CLEAR   Specific Gravity, Urine 1.015 1.005 - 1.030   pH 7.0 5.0 - 8.0   Glucose, UA NEGATIVE NEGATIVE mg/dL   Hgb urine dipstick LARGE (A) NEGATIVE   Bilirubin Urine NEGATIVE NEGATIVE   Ketones, ur NEGATIVE NEGATIVE mg/dL   Protein, ur  NEGATIVE NEGATIVE mg/dL   Nitrite NEGATIVE NEGATIVE   Leukocytes,Ua NEGATIVE NEGATIVE   RBC / HPF 0-5 0 - 5 RBC/hpf   WBC, UA 0-5 0 - 5 WBC/hpf   Squamous Epithelial / LPF 0-5 0 - 5  Pregnancy, urine  Collection Time: 02/26/21  4:25 PM  Result Value Ref Range   Preg Test, Ur POSITIVE (A) NEGATIVE  hCG, quantitative, pregnancy   Collection Time: 02/26/21  4:25 PM  Result Value Ref Range   hCG, Beta Chain, Quant, S 3,184 (H) <5 mIU/mL  Wet prep, genital   Collection Time: 02/26/21  6:26 PM   Specimen: Cervical/Vaginal swab  Result Value Ref Range   Yeast Wet Prep HPF POC NONE SEEN NONE SEEN   Trich, Wet Prep NONE SEEN NONE SEEN   Clue Cells Wet Prep HPF POC NONE SEEN NONE SEEN   WBC, Wet Prep HPF POC <10 <10   Sperm NONE SEEN   Resp Panel by RT-PCR (Flu A&B, Covid) Nasopharyngeal Swab   Collection Time: 02/26/21  8:30 PM   Specimen: Nasopharyngeal Swab; Nasopharyngeal(NP) swabs in vial transport medium  Result Value Ref Range   SARS Coronavirus 2 by RT PCR NEGATIVE NEGATIVE   Influenza A by PCR NEGATIVE NEGATIVE   Influenza B by PCR NEGATIVE NEGATIVE    Imaging Studies: US OB LESS THAN 14 WEEKS WITH OB TRANSVAGINAL  Result Date: 02/26/2021 CLINICAL DATA:  Right-sided abdominal pain with vaginal bleeding EXAM: OBSTETRIC <14 WK [place] AND TRANSVAGINAL OB US TECHNIQUE: Both transabdominal and transvaginal ultrasound examinations were performed for complete evaluation of the gestation as well as the maternal uterus, adnexal regions, and pelvic cul-de-sac. Transvaginal technique was performed to assess early pregnancy. COMPARISON:  None. FINDINGS: Intrauterine gestational sac: None mildly complex fluid in the endometrial canal. Yolk sac:  Not Visualized. Embryo:  Not Visualized. Maternal uterus/adnexae: Right ovary measures 2.9 by 3.2 x 3.2 cm. Left ovary measures 3.2 by 6.5 x 4 cm. Multiple anechoic cysts in the left ovary measuring up to 3.2 cm. Heterogenous masslike tissue in the right  adnexa with vascular flow. This measures 4 x 4.9 by 3.9 cm. There is moderate fluid in the pelvis. IMPRESSION: 1. No IUP identified. Heterogenous masslike tissue in the right adnexa with some flow, this measures 4.9 cm and is concerning for an ectopic pregnancy. Moderate free fluid in the pelvis raises concern for ruptured ectopic. Critical Value/emergent results were called by telephone at the time of interpretation on 02/26/2021 at 8:21 pm to provider [facility] , who verbally acknowledged these results. Electronically Signed   By: Donavan Foil M.D.   On: 02/26/2021 20:21    Assessment: Ruptured ectopic pregnancy Pelvic pain Undesired fertility Morbid obesity Prior C-section x 2   Plan: Based on US findings concern for ruptured ectopic- recommend surgical intervention with Diagnostic laparoscopy, removal of ectopic and bilateral salpingectomy due to desire for permanent sterilization -Risk/benefit reviewed including but not limited to risk of bleeding, infection and injury -Due to prior surgery and body habitus discussed potential to complete laparotomy -Questions and concerns were addressed and she desires to proceed -NPO -LR @ 125cc/hr -SCDs to OR -Lab work- CBC, hcG reviewed, T&S ordered  Janyth Pupa, DO Attending [place], Product/process development scientist for [employer], [place]

## 2021-02-26 NOTE — ED Notes (Signed)
Called [facility] to transport patient to [facility] MAU--Dr. Nelda Marseille accepting

## 2021-02-26 NOTE — ED Provider Notes (Signed)
[facility] EMERGENCY DEPT Provider Note   CSN: [id number] Arrival date & time: 02/26/21  1605     History Chief Complaint  Patient presents with   Abdominal Pain    Kathleen Osborne is a 35 y.o. female.  Pt presents to the ED today with RLQ abd pain.  Pt said she's had pain for about a week.  She said pain went away and has come back.  Pt said she had a miscarriage at the end of October.  She did not f/u with gyn to make sure her quant came down.  Pt started having some vaginal bleeding a few days ago.      Past Medical History:  Diagnosis Date   Anxiety    no meds while pregnant   Complication of anesthesia    very anxious on emergence   Depression    Family history of breast cancer    Family history of melanoma    Heartburn in pregnancy    Monoallelic mutation of CHEK2 gene     Patient Active Problem List   Diagnosis Date Noted   Encounter for removal of intrauterine contraceptive device (IUD) 03/47/4259   Monoallelic mutation of CHEK2 gene    Genetic testing 09/05/2014   Family history of breast cancer    Family history of melanoma    Obesity 08/01/2014   H/O: C-section 10/19/2012    Past Surgical History:  Procedure Laterality Date   CERVICAL CONE BIOPSY     2006   CESAREAN SECTION     NRFHR 1st Csection.   CESAREAN SECTION N/A 10/24/2012   Procedure: REPEAT CESAREAN SECTION;  Surgeon: Shelly Bombard, MD;  Location: [facility];  Service: Obstetrics;  Laterality: N/A;   DILITATION & CURRETTAGE/HYSTROSCOPY WITH HYDROTHERMAL ABLATION N/A 03/06/2019   Procedure: DILATATION & CURETTAGE/HYSTEROSCOPY WITH HYDROTHERMAL ABLATION;  Surgeon: [name], MD;  Location: [facility];  Service: Gynecology;  Laterality: N/A;   LEEP     TONSILLECTOMY AND ADENOIDECTOMY Bilateral 08/22/2017   Procedure: TONSILLECTOMY AND ADENOIDECTOMY;  Surgeon: Leta Baptist, MD;  Location: [facility];  Service: ENT;  Laterality: Bilateral;     OB  History     Gravida  3   Para  2   Term  2   Preterm      AB      Living  2      SAB      IAB      Ectopic      Multiple      Live Births  2           Family History  Problem Relation Age of Onset   Melanoma Mother 39   Early death Mother    Hyperlipidemia Father    Breast cancer Maternal Grandmother        dx in her 51s   Parkinson's disease Maternal Grandfather    Depression Maternal Grandfather    Drug abuse Maternal Grandfather    Mental illness Maternal Grandfather    Prostate cancer Maternal Grandfather    Colon cancer Maternal Grandfather    Breast cancer Maternal Aunt 33   Prostate cancer Paternal Grandfather    Melanoma Sister 4   Mental illness Maternal Aunt     Social History   Tobacco Use   Smoking status: Former    Types: E-cigarettes    Quit date: 03/14/2010    Years since quitting: 10.9   Smokeless tobacco: Never  Substance Use Topics  Alcohol use: Yes    Alcohol/week: 0.0 standard drinks    Comment: Occassionally   Drug use: No    Home Medications Prior to Admission medications   Medication Sig Start Date End Date Taking? Authorizing Provider  Multiple Vitamin (MULTIVITAMIN) tablet Take 1 tablet by mouth daily.    [provider]    Allergies    Patient has no known allergies.  Review of Systems   Review of Systems  Gastrointestinal:  Positive for abdominal pain.  Genitourinary:  Positive for vaginal bleeding.  All other systems reviewed and are negative.  Physical Exam Updated Vital Signs BP 123/70    Pulse 79    Temp 98 F (36.7 C) (Oral)    Resp 17    Ht _0  (1.549 m)    Wt 111.8 kg    LMP 02/07/2021    SpO2 100%    Breastfeeding Unknown    BMI 46.57 kg/m   Physical Exam Vitals and nursing note reviewed. Exam conducted with a chaperone present.  Constitutional:      Appearance: She is well-developed. She is obese.  HENT:     Head: Normocephalic and atraumatic.     Mouth/Throat:     Mouth: Mucous  membranes are moist.     Pharynx: Oropharynx is clear.  Eyes:     Extraocular Movements: Extraocular movements intact.     Pupils: Pupils are equal, round, and reactive to light.  Cardiovascular:     Rate and Rhythm: Normal rate and regular rhythm.  Pulmonary:     Effort: Pulmonary effort is normal.     Breath sounds: Normal breath sounds.  Abdominal:     General: Abdomen is flat. Bowel sounds are normal.     Palpations: Abdomen is soft.     Tenderness: There is abdominal tenderness in the right lower quadrant.  Genitourinary:    Exam position: Lithotomy position.     Vagina: Bleeding present.     Cervix: Cervical bleeding present.     Uterus: Normal.      Adnexa:        Right: Tenderness present.   Neurological:     Mental Status: She is alert.    ED Results / Procedures / Treatments   Labs (all labs ordered are listed, but only abnormal results are displayed) Labs Reviewed  CBC - Abnormal; Notable for the following components:      Result Value   WBC 13.5 (*)    All other components within normal limits  URINALYSIS, ROUTINE W REFLEX MICROSCOPIC - Abnormal; Notable for the following components:   Hgb urine dipstick LARGE (*)    All other components within normal limits  PREGNANCY, URINE - Abnormal; Notable for the following components:   Preg Test, Ur POSITIVE (*)    All other components within normal limits  HCG, QUANTITATIVE, PREGNANCY - Abnormal; Notable for the following components:   hCG, Beta Chain, Quant, S 3,184 (*)    All other components within normal limits  WET PREP, GENITAL  RESP PANEL BY RT-PCR (FLU A&B, COVID) ARPGX2  LIPASE, BLOOD  COMPREHENSIVE METABOLIC PANEL  GC/CHLAMYDIA PROBE AMP ([place]) NOT AT [facility]    EKG None  Radiology US OB LESS THAN 14 WEEKS WITH OB TRANSVAGINAL  Result Date: 02/26/2021 CLINICAL DATA:  Right-sided abdominal pain with vaginal bleeding EXAM: OBSTETRIC <14 WK [place] AND TRANSVAGINAL OB US TECHNIQUE: Both transabdominal  and transvaginal ultrasound examinations were performed for complete evaluation of the gestation as well  as the maternal uterus, adnexal regions, and pelvic cul-de-sac. Transvaginal technique was performed to assess early pregnancy. COMPARISON:  None. FINDINGS: Intrauterine gestational sac: None mildly complex fluid in the endometrial canal. Yolk sac:  Not Visualized. Embryo:  Not Visualized. Maternal uterus/adnexae: Right ovary measures 2.9 by 3.2 x 3.2 cm. Left ovary measures 3.2 by 6.5 x 4 cm. Multiple anechoic cysts in the left ovary measuring up to 3.2 cm. Heterogenous masslike tissue in the right adnexa with vascular flow. This measures 4 x 4.9 by 3.9 cm. There is moderate fluid in the pelvis. IMPRESSION: 1. No IUP identified. Heterogenous masslike tissue in the right adnexa with some flow, this measures 4.9 cm and is concerning for an ectopic pregnancy. Moderate free fluid in the pelvis raises concern for ruptured ectopic. Critical Value/emergent results were called by telephone at the time of interpretation on 02/26/2021 at 8:21 pm to provider [place] , who verbally acknowledged these results. Electronically Signed   By: Donavan Foil M.D.   On: 02/26/2021 20:21    Procedures Procedures   Medications Ordered in ED Medications  morphine 4 MG/ML injection 4 mg (4 mg Intravenous Given 02/26/21 1753)  ondansetron (ZOFRAN) injection 4 mg (4 mg Intravenous Given 02/26/21 1753)  sodium chloride 0.9 % bolus 1,000 mL (0 mLs Intravenous Stopped 02/26/21 2004)    ED Course  I have reviewed the triage vital signs and the nursing notes.  Pertinent labs & imaging results that were available during my care of the patient were reviewed by me and considered in my medical decision making (see chart for details).    MDM Rules/Calculators/A&P                         Blood type:  A+  [place] from 10/24 showed an intrauterine gestational sac, but no fetal pole or yolk sac.  Today, US shows a likely  ruptured ectopic pregnancy.  Pt's vitals have been stable.  Pt d/w Dr. Nelda Marseille (ob) who accepted her for transfer to the MAU.  CRITICAL CARE Performed by: Isla Pence   Total critical care time: 30 minutes  Critical care time was exclusive of separately billable procedures and treating other patients.  Critical care was necessary to treat or prevent imminent or life-threatening deterioration.  Critical care was time spent personally by me on the following activities: development of treatment plan with patient and/or surrogate as well as nursing, discussions with consultants, evaluation of patient's response to treatment, examination of patient, obtaining history from patient or surrogate, ordering and performing treatments and interventions, ordering and review of laboratory studies, ordering and review of radiographic studies, pulse oximetry and re-evaluation of patient's condition.   Final Clinical Impression(s) / ED Diagnoses Final diagnoses:  Right tubal pregnancy without intrauterine pregnancy  Ruptured ectopic pregnancy    Rx / DC Orders ED Discharge Orders     None        Isla Pence, MD 02/26/21 2030

## 2021-02-26 NOTE — MAU Note (Addendum)
Pt transferred via carelink from [facility] for ruptured ectopic. Pt states "I'm ok, the pain is starting to come back a little bit". Pt reports right lower abdominal pain started a week ago along with some spotting. Pt states the pain got worst today and the bleeding got heavier, so that is why she made the decision to go to the ER.   5/10 pain score

## 2021-02-27 DIAGNOSIS — O00101 Right tubal pregnancy without intrauterine pregnancy: Secondary | ICD-10-CM | POA: Diagnosis not present

## 2021-02-27 DIAGNOSIS — R102 Pelvic and perineal pain: Secondary | ICD-10-CM | POA: Diagnosis not present

## 2021-02-27 DIAGNOSIS — Z20822 Contact with and (suspected) exposure to covid-19: Secondary | ICD-10-CM | POA: Diagnosis not present

## 2021-02-27 MED ORDER — ONDANSETRON HCL 4 MG PO TABS: 4.0000 mg | ORAL_TABLET | Freq: Three times a day (TID) | ORAL | 0 refills | Status: DC | PRN

## 2021-02-27 MED ORDER — OXYCODONE HCL 5 MG PO TABS
ORAL_TABLET | ORAL | Status: AC
  Filled 2021-02-27: qty 1

## 2021-02-27 MED ORDER — AMISULPRIDE (ANTIEMETIC) 5 MG/2ML IV SOLN
INTRAVENOUS | Status: AC
  Filled 2021-02-27: qty 4

## 2021-02-27 MED ORDER — ACETAMINOPHEN 10 MG/ML IV SOLN
INTRAVENOUS | Status: AC
  Filled 2021-02-27: qty 100

## 2021-02-27 NOTE — Op Note (Signed)
PREOPERATIVE DIAGNOSIS:  1) Ruptured Ectopic 2) Desire for permanent sterilization 3) Morbid obesity 4) Prior C-section x 2 POSTOPERATIVE DIAGNOSIS: same PROCEDURE PERFORMED: Diagnostic laparoscopy, removal of ectopic pregnancy, bilateral salpingectomy SURGEON: Dr. Janyth Pupa ANESTHESIA: General endotracheal.  ESTIMATED BLOOD LOSS: 50cc.  URINE OUTPUT: 200cc of clear yellow urine at the end of the procedure.  IV FLUIDS: 1000cc of crystalloid.  SPECIMEN(S): bilateral fallopian tubes with ectopic pregnancy COMPLICATIONS: None.  CONDITION: Stable.  FINDINGS: No ascites or peritoneal studding was appreciated.  Liver, gallbladder and bowel appeared grossly normal.  Moderate omental adhesions noted at periumbilical area.  Uterus normal size and shape.  Right ectopic pregnancy, slightly enlarged left ovary, but otherwise normal appearing ovaries bilaterally. ~ 50cc of blood was noted in the pelvis.   Informed consent was obtained from the patient prior to taking her to the operating room where anesthesia was found to be adequate. She was placed in dorsal lithotomy position and examined under anesthesia. She was prepped and draped in normal sterile fashion. The bladder was catheterized with a foley under sterile technique.  A bi-valve speculum was then placed and the anterior lip of the cervix was grasped with the single tooth tenaculum. The Hulka uterine manipulator was then advanced into the uterus to provide uterine mobility. The speculum and tenaculum were then removed.   Attention was then turned to the patients abdomen, 10cc of quarter percent marcaine was injected.  A 10 mm infraumbilical skin incision was made with the scalpel. The veress needle was carefully introduced into the peritoneal cavity while tenting the abdominal wall. Intraperitoneal placement was confirmed by use of a saline-drop test.  The gas was connected and confirmed intrabdominal placement by a low initial pressure of  67mmHg. The abdomen was then insuflated with CO2 gas. The trocar and sleeve were then advanced without difficulty into the abdomen under direct visualization. Intraabdominal placement was confirmed by the laparoscope and surveillance of the abdomen was performed. Grossly normal appearing abdomen as mentioned in findings above.  Two additional 65mm skin incision were made in the left and right lower quadrants with placement of the trocar under direct visualization.   Upon examination of the pelvis, omental adhesions were noted at the umbilical port.  The 72mm camera was placed in the right lower quadrant port to better examine the adhesions.  Bowel was not appreciated within this area.  No bleeding was appreciated.  There was no evidence of bowel injury.    The left fallopian tube was then placed on tension and using the Harmonic, was clamped and serially ligated. Attention was then turned to the right fallopian tube and noted to contain the ectopic pregnancy.  In a similar fashion,  the right fallopian tube was then dissected.  The endocatch bag was then placed through the 11 mm port and the specimens were removed in their entirety. Irrigation of the pelvis was performed.  The adnexa were both examined and hemostasis was confirmed.   Arista was placed over the bilateral adnexa. The 99mm trocar was then removed.  The fascia was grasped with a Kocher and closed under direct visualization in a running fashion using 0-vicryl.  The 37mm camera was then re-inserted and the umbilical area was examined.  The adhesions appeared stable- they did not appear to be pulled up within the port and again no bleeding was noted.  The remaining trocars were then removed from the patients abdomen with air allowed to fully escape. The port sites were then closed with monocryl  and dermabond.  The manipulator and foley catheter was then removed from the cervix with no lacerations or bleeding identified. The patient tolerated the  procedure well with all sponge, lap, and needle counts correct. The patient was taken to recovery in stable condition.    Janyth Pupa, DO Attending [place], [facility] for [employer], [place]

## 2021-02-27 NOTE — Transfer of Care (Signed)
Immediate Anesthesia Transfer of Care Note  Patient: Kathleen Osborne  Procedure(s) Performed: LAPAROSCOPIC BILATERAL SALPINGECTOMY WITH REMOVAL OF ECTOPIC PREGNANCY (Bilateral)  Patient Location: PACU  Anesthesia Type:General  Level of Consciousness: drowsy  Airway & Oxygen Therapy: Patient Spontanous Breathing and Patient connected to nasal cannula oxygen  Post-op Assessment: Report given to RN, Post -op Vital signs reviewed and stable and Patient moving all extremities  Post vital signs: Reviewed and stable  Last Vitals:  Vitals Value Taken Time  BP 130/71 02/27/21 0003  Temp    Pulse 90 02/27/21 0008  Resp 20 02/27/21 0008  SpO2 96 % 02/27/21 0008  Vitals shown include unvalidated device data.  Last Pain:  Vitals:   02/26/21 2153  TempSrc:   PainSc: 5          Complications: No notable events documented.

## 2021-02-27 NOTE — Anesthesia Postprocedure Evaluation (Signed)
Anesthesia Post Note  Patient: MAREA REASNER  Procedure(s) Performed: LAPAROSCOPIC BILATERAL SALPINGECTOMY WITH REMOVAL OF ECTOPIC PREGNANCY (Bilateral)     Patient location during evaluation: PACU Anesthesia Type: General Level of consciousness: awake Pain management: pain level controlled Vital Signs Assessment: post-procedure vital signs reviewed and stable Respiratory status: spontaneous breathing, nonlabored ventilation, respiratory function stable and patient connected to nasal cannula oxygen Cardiovascular status: blood pressure returned to baseline and stable Postop Assessment: no apparent nausea or vomiting Anesthetic complications: no   No notable events documented.  Last Vitals:  Vitals:   02/27/21 0050 02/27/21 0105  BP: 115/62 (!) 116/59  Pulse: 84 86  Resp: 20 19  Temp:  37 C  SpO2: 97% 96%    Last Pain:  Vitals:   02/27/21 0105  TempSrc:   PainSc: 7                  Jhett Fretwell P Marylon Verno

## 2021-02-28 ENCOUNTER — Encounter (HOSPITAL_COMMUNITY): Payer: Self-pay | Admitting: Obstetrics & Gynecology

## 2021-03-01 LAB — GC/CHLAMYDIA PROBE AMP (~~LOC~~) NOT AT ARMC
Chlamydia: NEGATIVE
Comment: NEGATIVE
Comment: NORMAL
Neisseria Gonorrhea: NEGATIVE

## 2021-03-02 LAB — SURGICAL PATHOLOGY

## 2021-03-03 ENCOUNTER — Telehealth: Payer: Self-pay | Admitting: Licensed Clinical Social Worker

## 2021-03-03 NOTE — Telephone Encounter (Signed)
Called pt regarding IBH referral. Pt reports she did not need referral at this time and will call appt desk as needed for [facility]BH appt.

## 2021-03-11 ENCOUNTER — Other Ambulatory Visit: Payer: Self-pay

## 2021-03-11 ENCOUNTER — Ambulatory Visit (INDEPENDENT_AMBULATORY_CARE_PROVIDER_SITE_OTHER): Payer: 59 | Admitting: Obstetrics & Gynecology

## 2021-03-11 ENCOUNTER — Encounter: Payer: Self-pay | Admitting: Obstetrics & Gynecology

## 2021-03-11 VITALS — BP 132/79 | HR 93 | Ht 61.0 in | Wt 249.2 lb

## 2021-03-11 DIAGNOSIS — Z1502 Genetic susceptibility to malignant neoplasm of ovary: Secondary | ICD-10-CM

## 2021-03-11 DIAGNOSIS — Z1589 Genetic susceptibility to other disease: Secondary | ICD-10-CM

## 2021-03-11 DIAGNOSIS — Z4889 Encounter for other specified surgical aftercare: Secondary | ICD-10-CM

## 2021-03-11 DIAGNOSIS — Z1509 Genetic susceptibility to other malignant neoplasm: Secondary | ICD-10-CM

## 2021-03-11 DIAGNOSIS — Z1501 Genetic susceptibility to malignant neoplasm of breast: Secondary | ICD-10-CM

## 2021-03-11 NOTE — Progress Notes (Signed)
° ° °  Post Op Visit ° °Kathleen Osborne is a 35 y.o. G5P2022 female who presents for a postoperative visit. She is 2 weeks postop following a 12/16- laparoscopic ectopic removal/bilateral salpingectomy.  ° °Today she notes she is doing great.  Used oxy for a 2-3 days, only taking ibuprofen on occasion. °Denies fever or chills.  Tolerating gen diet.  +Flatus, Regular BMs.  Overall doing well and reports no acute complaints.  Did have a period a few days after, but no further bleeding. ° °Records reviewed- last pap 2016- pt does think she has had a pap since that time- though not in records ° °+CHEK2- increased risk of breast Ca- pt noted that surgeon desires weight loss prior to proceeding with surgery.  States her weight has always been a struggle and feels like she already eats a balanced diet and walks her dog 2x per day.  She is not sure what else she could change to promote weight loss. ° ° [facility] - 03/11/21 1411   ° °  ° Pregnancy Intention Screening  ° Does the patient want to become pregnant in the next year? N/A   ° Does the patient's partner want to become pregnant in the next year? N/A   °  ° Contraception Wrap Up  ° Current Method Female Sterilization   ° End Method Female Sterilization   ° Contraception Counseling Provided No   ° °  °  ° °  ° ° °Review of Systems °Pertinent items are noted in HPI.   ° °Objective:  °BP 132/79 (BP Location: Left Arm, Patient Position: Sitting, Cuff Size: Large)    Pulse 93    Ht 5' 1" (1.549 m)    Wt 249 lb 3.2 oz (113 kg)    Breastfeeding No    BMI 47.09 kg/m²   ° °Physical Examination:  °GENERAL ASSESSMENT: well developed and well nourished °SKIN: warm and dry °CHEST: normal air exchange, respiratory effort normal with no retractions °HEART: regular rate and rhythm °ABDOMEN: soft, non-distended, non-tender °INCISION: well healed- clean/dry/intact °EXTREMITY: no edema °PSYCH: mood appropriate, normal affect °      °Assessment:  ° ° Postop incision check °Obesity   ° °Plan:  ° °-meeting milestones appropriately, may return to regular activity °-plan for annual in May with pap ° °-Obesity-  [] referral to Weight loss Management- Dr. Ukleja in [place] °Pt to call when insurance has changed ° ° °Jennifer Ozan, DO °Attending Obstetrician & Gynecologist, [facility] °[facility] ° °  °

## 2021-06-17 DIAGNOSIS — Z03818 Encounter for observation for suspected exposure to other biological agents ruled out: Secondary | ICD-10-CM | POA: Diagnosis not present

## 2021-06-17 DIAGNOSIS — J029 Acute pharyngitis, unspecified: Secondary | ICD-10-CM | POA: Diagnosis not present

## 2021-06-17 DIAGNOSIS — Z20822 Contact with and (suspected) exposure to covid-19: Secondary | ICD-10-CM | POA: Diagnosis not present

## 2022-02-14 ENCOUNTER — Encounter: Payer: Self-pay | Admitting: Family Medicine

## 2022-02-14 ENCOUNTER — Ambulatory Visit: Payer: 59 | Admitting: Family Medicine

## 2022-02-14 VITALS — BP 140/100 | HR 81 | Temp 98.1°F | Ht 62.0 in | Wt 263.0 lb

## 2022-02-14 DIAGNOSIS — F419 Anxiety disorder, unspecified: Secondary | ICD-10-CM | POA: Diagnosis not present

## 2022-02-14 DIAGNOSIS — Z6841 Body Mass Index (BMI) 40.0 and over, adult: Secondary | ICD-10-CM

## 2022-02-14 DIAGNOSIS — Z0000 Encounter for general adult medical examination without abnormal findings: Secondary | ICD-10-CM | POA: Diagnosis not present

## 2022-02-14 DIAGNOSIS — R69 Illness, unspecified: Secondary | ICD-10-CM | POA: Diagnosis not present

## 2022-02-14 MED ORDER — PROPRANOLOL HCL 10 MG PO TABS: 10.0000 mg | ORAL_TABLET | Freq: Every day | ORAL | 0 refills | Status: DC

## 2022-02-14 NOTE — Assessment & Plan Note (Signed)
Kathleen Osborne is very anxious today and reports situational anxiety and anxiety at night. I believe this is contributing to her challenges with her weight loss as well. She does not want to take a medication daily. I strongly encouraged therapy in person or online. Will start propanolol for situational anxiety '10mg'$  PRN daily and follow-up in 1 month.

## 2022-02-14 NOTE — Progress Notes (Signed)
New Patient Office Visit  Subjective    Patient ID: Kathleen Osborne, female    DOB: 1985/07/14  Age: 36 y.o. MRN: [id number]  CC:  Chief Complaint  Patient presents with   Establish Care   Follow-up    Like to discuss controlling wt (options)    HPI  Kathleen Osborne presents to establish care. Oriented to practice routines and expectations. No significant chronic conditions. She has an OBGYN who manages her gynecologic concerns at [facility]. Last PAP 3y ago, history of fibroids, ablation, LEEP x2, ectopic with fallopian tube rupture and bilateral salpingectomy. LMP November 22, cycles are every 30 days and last 5-7 days, debilitating cramping, heavy bleeding, declines contraception.  Her BP is elevated today, she reports some anxiety about her visit today. GAD-7 is 14 today. She reports her anxiety is worse for certain situations and at night, but affects her sleep every night. She is not seeing a therapist currently. She is reluctant to take medications.  Primary concerns include weight management. She has tried Phentermine, herbalife, and currently consumes a high protein diet without calorie restriction. She walks twice a day for 30 minutes with her dogs.    Outpatient Encounter Medications as of 02/14/2022  Medication Sig   propranolol (INDERAL) 10 MG tablet Take 1 tablet (10 mg total) by mouth daily.   [DISCONTINUED] acetaminophen (TYLENOL) 325 MG tablet Take 2 tablets (650 mg total) by mouth every 6 (six) hours as needed for mild pain or moderate pain. (Patient not taking: Reported on 02/14/2022)   [DISCONTINUED] ibuprofen (ADVIL) 800 MG tablet Take 1 tablet (800 mg total) by mouth every 8 (eight) hours as needed. (Patient not taking: Reported on 02/14/2022)   [DISCONTINUED] Multiple Vitamin (MULTIVITAMIN) tablet Take 1 tablet by mouth daily. (Patient not taking: Reported on 02/14/2022)   [DISCONTINUED] ondansetron (ZOFRAN) 4 MG tablet Take 1 tablet (4 mg total) by mouth every 8  (eight) hours as needed for nausea or vomiting. (Patient not taking: Reported on 02/14/2022)   No facility-administered encounter medications on file as of 02/14/2022.    Past Medical History:  Diagnosis Date   Anxiety    no meds while pregnant   Complication of anesthesia    very anxious on emergence   Depression    Family history of breast cancer    Family history of melanoma    Heartburn in pregnancy    Monoallelic mutation of CHEK2 gene     Past Surgical History:  Procedure Laterality Date   CERVICAL CONE BIOPSY     2006   CESAREAN SECTION     NRFHR 1st Csection.   CESAREAN SECTION N/A 10/24/2012   Procedure: REPEAT CESAREAN SECTION;  Surgeon: Shelly Bombard, MD;  Location: [facility];  Service: Obstetrics;  Laterality: N/A;   DILITATION & CURRETTAGE/HYSTROSCOPY WITH HYDROTHERMAL ABLATION N/A 03/06/2019   Procedure: DILATATION & CURETTAGE/HYSTEROSCOPY WITH HYDROTHERMAL ABLATION;  Surgeon: [name], MD;  Location: [place];  Service: Gynecology;  Laterality: N/A;   LAPAROSCOPIC BILATERAL SALPINGECTOMY Bilateral 02/26/2021   Procedure: LAPAROSCOPIC BILATERAL SALPINGECTOMY WITH REMOVAL OF ECTOPIC PREGNANCY;  Surgeon: Janyth Pupa, DO;  Location: [place];  Service: Gynecology;  Laterality: Bilateral;   LEEP     TONSILLECTOMY AND ADENOIDECTOMY Bilateral 08/22/2017   Procedure: TONSILLECTOMY AND ADENOIDECTOMY;  Surgeon: Leta Baptist, MD;  Location: [place];  Service: ENT;  Laterality: Bilateral;    Family History  Problem Relation Age of Onset   Melanoma Mother 29  Early death Mother    Hyperlipidemia Father    Breast cancer Maternal Grandmother        dx in her 36s   Parkinson's disease Maternal Grandfather    Depression Maternal Grandfather    Drug abuse Maternal Grandfather    Mental illness Maternal Grandfather    Prostate cancer Maternal Grandfather    Colon cancer Maternal Grandfather    Breast cancer Maternal Aunt 33   Prostate  cancer Paternal Grandfather    Melanoma Sister 39   Mental illness Maternal Aunt     Social History   Socioeconomic History   Marital status: Soil scientist    Spouse name: Not on file   Number of children: 2   Years of education: Not on file   Highest education level: Not on file  Occupational History   Not on file  Tobacco Use   Smoking status: Former    Types: E-cigarettes    Quit date: 03/14/2010    Years since quitting: 11.9   Smokeless tobacco: Never  Vaping Use   Vaping Use: Some days  Substance and Sexual Activity   Alcohol use: Yes    Alcohol/week: 0.0 standard drinks of alcohol    Comment: Occassionally   Drug use: No   Sexual activity: Not Currently    Partners: Male    Birth control/protection: Surgical  Other Topics Concern   Not on file  Social History Narrative   Not on file   Social Determinants of Health   Financial Resource Strain: Not on file  Food Insecurity: Not on file  Transportation Needs: Not on file  Physical Activity: Not on file  Stress: Not on file  Social Connections: Not on file  Intimate Partner Violence: Not on file    Review of Systems  Reason unable to perform ROS: see HPI.  All other systems reviewed and are negative.     02/14/2022    3:36 PM 01/04/2021   10:07 AM 12/28/2020   10:46 AM  GAD 7 : Generalized Anxiety Score  Nervous, Anxious, on Edge _0 Control/stop worrying _1 Worry too much - different things _2 Trouble relaxing _3 Restless _4 Easily annoyed or irritable _5 Afraid - awful might happen _6 Total GAD 7 Score _7 Anxiety Difficulty Somewhat difficult  Not difficult at all         Objective    BP (!) 140/100   Pulse 81   Temp 98.1 F (36.7 C) (Oral)   Ht _8  (1.575 m)   Wt 263 lb (119.3 kg)   LMP 02/07/2022   SpO2 98%   BMI 48.10 kg/m   Physical Exam Vitals and nursing note reviewed.  Constitutional:      Appearance: Normal appearance. She is obese.   HENT:     Head: Normocephalic and atraumatic.  Cardiovascular:     Rate and Rhythm: Normal rate and regular rhythm.     Heart sounds: Normal heart sounds.  Pulmonary:     Breath sounds: Normal breath sounds.  Skin:    General: Skin is warm and dry.  Neurological:     General: No focal deficit present.     Mental Status: She is alert and oriented to person, place, and time.  Psychiatric:        Mood and Affect: Mood normal.  Behavior: Behavior normal.        Thought Content: Thought content normal.        Judgment: Judgment normal.        Assessment & Plan:   Problem List Items Addressed This Visit       Other   Anxiety - Primary    Ms Oppedisano is very anxious today and reports situational anxiety and anxiety at night. I believe this is contributing to her challenges with her weight loss as well. She does not want to take a medication daily. I strongly encouraged therapy in person or online. Will start propanolol for situational anxiety 9m PRN daily and follow-up in 1 month.      Morbid obesity with BMI of 45.0-49.9, adult ([facility]    Discussed importance of weight control for overall health. She has been offered bariatric surgery in the past and has tried Phentermine and diet. She would like to try medical management with weekly injectables and will check her insurance coverage. I encouraged a calorie restricted diet and exercise. I offered medical weight management but she declined. Will determine coverage and proceed as appropriate.      Other Visit Diagnoses     Physical exam, annual       Relevant Orders   COMPLETE METABOLIC PANEL WITH GFR   CBC with Differential/Platelet   Lipid panel   TSH       Return in about 4 weeks (around 03/14/2022) for follow-up.   ARubie Maid FNP

## 2022-02-14 NOTE — Assessment & Plan Note (Signed)
Discussed importance of weight control for overall health. She has been offered bariatric surgery in the past and has tried Phentermine and diet. She would like to try medical management with weekly injectables and will check her insurance coverage. I encouraged a calorie restricted diet and exercise. I offered medical weight management but she declined. Will determine coverage and proceed as appropriate.

## 2022-02-21 ENCOUNTER — Other Ambulatory Visit: Payer: 59

## 2022-02-23 ENCOUNTER — Other Ambulatory Visit: Payer: 59

## 2022-02-23 DIAGNOSIS — Z0000 Encounter for general adult medical examination without abnormal findings: Secondary | ICD-10-CM | POA: Diagnosis not present

## 2022-02-23 NOTE — Telephone Encounter (Signed)
Returned call to patient to discuss further her concerns regarding her medications.

## 2022-02-24 LAB — COMPLETE METABOLIC PANEL WITH GFR
AG Ratio: 1.5 (calc) (ref 1.0–2.5)
ALT: 21 U/L (ref 6–29)
AST: 17 U/L (ref 10–30)
Albumin: 4.2 g/dL (ref 3.6–5.1)
Alkaline phosphatase (APISO): 78 U/L (ref 31–125)
BUN/Creatinine Ratio: 9 (calc) (ref 6–22)
BUN: 6 mg/dL — ABNORMAL LOW (ref 7–25)
CO2: 23 mmol/L (ref 20–32)
Calcium: 8.9 mg/dL (ref 8.6–10.2)
Chloride: 105 mmol/L (ref 98–110)
Creat: 0.67 mg/dL (ref 0.50–0.97)
Globulin: 2.8 g/dL (calc) (ref 1.9–3.7)
Glucose, Bld: 93 mg/dL (ref 65–99)
Potassium: 4.5 mmol/L (ref 3.5–5.3)
Sodium: 139 mmol/L (ref 135–146)
Total Bilirubin: 0.3 mg/dL (ref 0.2–1.2)
Total Protein: 7 g/dL (ref 6.1–8.1)
eGFR: 117 mL/min/{1.73_m2} (ref >=60)

## 2022-02-24 LAB — LIPID PANEL
Cholesterol: 198 mg/dL (ref <200)
HDL: 44 mg/dL — ABNORMAL LOW (ref >=50)
LDL Cholesterol (Calc): 131 mg/dL (calc) — ABNORMAL HIGH
Non-HDL Cholesterol (Calc): 154 mg/dL (calc) — ABNORMAL HIGH (ref <130)
Total CHOL/HDL Ratio: 4.5 (calc) (ref <5.0)
Triglycerides: 124 mg/dL (ref <150)

## 2022-02-24 LAB — CBC WITH DIFFERENTIAL/PLATELET
Absolute Monocytes: 475 cells/uL (ref 200–950)
Basophils Absolute: 30 cells/uL (ref 0–200)
Basophils Relative: 0.6 %
Eosinophils Absolute: 50 cells/uL (ref 15–500)
Eosinophils Relative: 1 %
HCT: 40.2 % (ref 35.0–45.0)
Hemoglobin: 13.4 g/dL (ref 11.7–15.5)
Lymphs Abs: 1230 cells/uL (ref 850–3900)
MCH: 27.2 pg (ref 27.0–33.0)
MCHC: 33.3 g/dL (ref 32.0–36.0)
MCV: 81.7 fL (ref 80.0–100.0)
MPV: 10.5 fL (ref 7.5–12.5)
Monocytes Relative: 9.5 %
Neutro Abs: 3215 cells/uL (ref 1500–7800)
Neutrophils Relative %: 64.3 %
Platelets: 272 10*3/uL (ref 140–400)
RBC: 4.92 10*6/uL (ref 3.80–5.10)
RDW: 13.6 % (ref 11.0–15.0)
Total Lymphocyte: 24.6 %
WBC: 5 10*3/uL (ref 3.8–10.8)

## 2022-02-24 LAB — TSH: TSH: 1.3 mIU/L

## 2022-03-16 ENCOUNTER — Encounter: Payer: Self-pay | Admitting: Family Medicine

## 2022-03-16 ENCOUNTER — Other Ambulatory Visit: Payer: Self-pay | Admitting: Family Medicine

## 2022-03-16 ENCOUNTER — Telehealth: Payer: Self-pay

## 2022-03-16 ENCOUNTER — Ambulatory Visit: Payer: 59 | Admitting: Family Medicine

## 2022-03-16 VITALS — BP 135/80 | HR 98 | Temp 97.9°F | Ht 62.0 in | Wt 265.0 lb

## 2022-03-16 DIAGNOSIS — F419 Anxiety disorder, unspecified: Secondary | ICD-10-CM | POA: Diagnosis not present

## 2022-03-16 DIAGNOSIS — Z6841 Body Mass Index (BMI) 40.0 and over, adult: Secondary | ICD-10-CM

## 2022-03-16 DIAGNOSIS — F909 Attention-deficit hyperactivity disorder, unspecified type: Secondary | ICD-10-CM | POA: Diagnosis not present

## 2022-03-16 DIAGNOSIS — Z79899 Other long term (current) drug therapy: Secondary | ICD-10-CM | POA: Insufficient documentation

## 2022-03-16 MED ORDER — ALPRAZOLAM 0.25 MG PO TABS: 0.2500 mg | ORAL_TABLET | Freq: Two times a day (BID) | ORAL | 0 refills | Status: DC | PRN

## 2022-03-16 MED ORDER — METHYLPHENIDATE HCL ER (CD) 10 MG PO CPCR: 10.0000 mg | ORAL_CAPSULE | ORAL | 0 refills | Status: DC

## 2022-03-16 NOTE — Telephone Encounter (Addendum)
Pt called, need to resend her med Metadate '10mg'$  to [facility] on [address] for only 30DS due to [employer] will not cover 90DS.    Kristopher Oppenheim [address] [place], [place] [address] [place]

## 2022-03-16 NOTE — Progress Notes (Signed)
Acute Office Visit  Subjective:     Patient ID: Kathleen Osborne, female    DOB: 12-26-1985, 37 y.o.   MRN: [id number]  Chief Complaint  Patient presents with   Follow-up    4 week f/u medication/ concern anxiety    HPI Patient is in today for ongoing anxiety. She does report some frustrations regarding trying to reach our office with questions regarding her Propanolol as this medication was ineffective for managing her anxiety. Her GAD today was 21 from 14 on 02/14/2022. Kathleen Osborne is concerned that her anxiety may be related to her uncontrolled ADHD. She was diagnosed previously at the age of 7 and has tried Vyvanse and Adderall but discontinued medication when she became pregnant. Her symptoms are currently affecting her at home and at work and have been ongoing for >6 months. She scored often or very often in every symptom on the ASRS indicating severe symptoms highly indicative of ADHD. Kathleen Osborne would like to try to medically manage her ADHD and see if this subsequently helps her anxiety. She also requests PRN low dose Xanax as this worked for her in the past vs. Propanolol.  Review of Systems  Cardiovascular: Negative.   Neurological: Negative.   Psychiatric/Behavioral:  The patient is nervous/anxious.     Past Medical History:  Diagnosis Date   Anxiety    no meds while pregnant   Complication of anesthesia    very anxious on emergence   Depression    Family history of breast cancer    Family history of melanoma    Heartburn in pregnancy    Monoallelic mutation of CHEK2 gene    Past Surgical History:  Procedure Laterality Date   CERVICAL CONE BIOPSY     2006   CESAREAN SECTION     NRFHR 1st Csection.   CESAREAN SECTION N/A 10/24/2012   Procedure: REPEAT CESAREAN SECTION;  Surgeon: Shelly Bombard, MD;  Location: [facility];  Service: Obstetrics;  Laterality: N/A;   DILITATION & CURRETTAGE/HYSTROSCOPY WITH HYDROTHERMAL ABLATION N/A 03/06/2019   Procedure: DILATATION &  CURETTAGE/HYSTEROSCOPY WITH HYDROTHERMAL ABLATION;  Surgeon: [name], MD;  Location: [place];  Service: Gynecology;  Laterality: N/A;   LAPAROSCOPIC BILATERAL SALPINGECTOMY Bilateral 02/26/2021   Procedure: LAPAROSCOPIC BILATERAL SALPINGECTOMY WITH REMOVAL OF ECTOPIC PREGNANCY;  Surgeon: Janyth Pupa, DO;  Location: [place];  Service: Gynecology;  Laterality: Bilateral;   LEEP     TONSILLECTOMY AND ADENOIDECTOMY Bilateral 08/22/2017   Procedure: TONSILLECTOMY AND ADENOIDECTOMY;  Surgeon: Leta Baptist, MD;  Location: [place];  Service: ENT;  Laterality: Bilateral;   No current outpatient medications on file prior to visit.   No current facility-administered medications on file prior to visit.   No Known Allergies      Objective:    BP 135/80   Pulse 98   Temp 97.9 F (36.6 C) (Oral)   Ht _0  (1.575 m)   Wt 265 lb (120.2 kg)   LMP 02/07/2022   SpO2 99%   BMI 48.47 kg/m    Physical Exam Vitals and nursing note reviewed.  Constitutional:      Appearance: Normal appearance. She is normal weight.  HENT:     Head: Normocephalic and atraumatic.  Cardiovascular:     Rate and Rhythm: Normal rate and regular rhythm.     Pulses: Normal pulses.     Heart sounds: Normal heart sounds.  Skin:    General: Skin is warm and dry.  Neurological:     General: No focal deficit present.     Mental Status: She is alert and oriented to person, place, and time. Mental status is at baseline.  Psychiatric:        Mood and Affect: Mood is anxious.        Behavior: Behavior normal.        Thought Content: Thought content normal.        Judgment: Judgment normal.     No results found for any visits on 03/16/22.      Assessment & Plan:   Problem List Items Addressed This Visit       Other   Anxiety    Uncontrolled. Kathleen Osborne feels her anxiety is largely related to her inability to complete tasks. GAD 21 from 14 today. She would like to try Xanax  0.76m as this previously worked for her. I discussed with her my reluctance to start her on 2 medications simultaneously and she agreed to use the Xanax sparingly, will prescribe 20 tablets at this time while we evaluate effectiveness of the Metadate.      Relevant Medications   ALPRAZolam (XANAX) 0.25 MG tablet   Adult ADHD - Primary    Uncontrolled chronic. She is interested in medication management at this time. We had a long discussion about her symptoms and treatment options and she would like to try Methylphenidate 171mdaily. She will start this tomorrow and we will have a virtual follow up visit in 1 week to evaluate effectiveness. She denies personal or family history of sudden cardiac death or arrhythmias.      Controlled substance agreement signed   Relevant Orders   DRUG MONITOR, PANEL 1, W/CONF, URINE   Other Visit Diagnoses     Morbid obesity ([place]  (Chronic)     Relevant Medications   methylphenidate (METADATE CD) 10 MG CR capsule       Meds ordered this encounter  Medications   ALPRAZolam (XANAX) 0.25 MG tablet    Sig: Take 1 tablet (0.25 mg total) by mouth 2 (two) times daily as needed for anxiety.    Dispense:  20 tablet    Refill:  0    Order Specific Question:   Supervising Provider    Answer:   PIJenna Luo [[id number]]   methylphenidate (METADATE CD) 10 MG CR capsule    Sig: Take 1 capsule (10 mg total) by mouth every morning.    Dispense:  90 capsule    Refill:  0    Order Specific Question:   Supervising Provider    Answer:   PIJenna Luo [[id number]]    Return in about 1 week (around 03/23/2022).  AmRubie MaidFNP

## 2022-03-16 NOTE — Assessment & Plan Note (Signed)
Uncontrolled chronic. She is interested in medication management at this time. We had a long discussion about her symptoms and treatment options and she would like to try Methylphenidate '10mg'$  daily. She will start this tomorrow and we will have a virtual follow up visit in 1 week to evaluate effectiveness. She denies personal or family history of sudden cardiac death or arrhythmias.

## 2022-03-16 NOTE — Assessment & Plan Note (Signed)
Uncontrolled. Kathleen Osborne feels her anxiety is largely related to her inability to complete tasks. GAD 21 from 14 today. She would like to try Xanax 0.'25mg'$  as this previously worked for her. I discussed with her my reluctance to start her on 2 medications simultaneously and she agreed to use the Xanax sparingly, will prescribe 20 tablets at this time while we evaluate effectiveness of the Metadate.

## 2022-03-19 LAB — DRUG MONITOR, PANEL 1, W/CONF, URINE
Amphetamines: NEGATIVE ng/mL (ref <500)
Barbiturates: NEGATIVE ng/mL (ref <300)
Benzodiazepines: NEGATIVE ng/mL (ref <100)
Cocaine Metabolite: NEGATIVE ng/mL (ref <150)
Creatinine: 120.5 mg/dL (ref >=20.0)
Marijuana Metabolite: 69 ng/mL — ABNORMAL HIGH (ref <5)
Marijuana Metabolite: POSITIVE ng/mL — AB (ref <20)
Methadone Metabolite: NEGATIVE ng/mL (ref <100)
Opiates: NEGATIVE ng/mL (ref <100)
Oxidant: NEGATIVE ug/mL (ref <200)
Oxycodone: NEGATIVE ng/mL (ref <100)
Phencyclidine: NEGATIVE ng/mL (ref <25)
pH: 7.2 (ref 4.5–9.0)

## 2022-03-19 LAB — DM TEMPLATE

## 2022-03-24 ENCOUNTER — Telehealth (INDEPENDENT_AMBULATORY_CARE_PROVIDER_SITE_OTHER): Payer: 59 | Admitting: Family Medicine

## 2022-03-24 DIAGNOSIS — F419 Anxiety disorder, unspecified: Secondary | ICD-10-CM | POA: Diagnosis not present

## 2022-03-24 DIAGNOSIS — F909 Attention-deficit hyperactivity disorder, unspecified type: Secondary | ICD-10-CM | POA: Diagnosis not present

## 2022-03-24 NOTE — Assessment & Plan Note (Signed)
Symptoms improving on Metadate CD '10mg'$  daily. She reports anxiety symptoms improving as well and only having taken the Xanax 0.'25mg'$  once. Continue current regimen and will follow up in 1 month.

## 2022-03-24 NOTE — Progress Notes (Signed)
Virtual Visit via Video note  I connected with Kathleen Osborne on 03/24/22 at 1201 by video and verified that I am speaking with the correct person using two identifiers. Kathleen Osborne is currently located at home and no one is currently with her during visit. The provider, Rubie Maid, FNP is located in their office at time of visit.  I discussed the limitations, risks, security and privacy concerns of performing an evaluation and management service by video and the availability of in person appointments. I also discussed with the patient that there may be a patient responsible charge related to this service. The patient expressed understanding and agreed to proceed.  Subjective: PCP: Rubie Maid, FNP  No chief complaint on file.   HPI  ADHD FOLLOW UP ADHD status: better Satisfied with current therapy: yes Medication compliance:  excellent compliance Controlled substance contract: yes Previous psychiatry evaluation: yes Previous medications: yes concerta   Taking meds on weekends/vacations: yes Work/school performance:   between jobs Difficulty sustaining attention/completing tasks:  improving Distracted by extraneous stimuli:  improving Does not listen when spoken to: no  Fidgets with hands or feet: yes Unable to stay in seat:  improving Blurts out/interrupts others:  improving ADHD Medication Side Effects: no    Decreased appetite: no    Headache: no    Sleeping disturbance pattern: no    Irritability: no    Rebound effects (worse than baseline) off medication: no    Anxiousness: no    Dizziness: no    Tics: no   ROS: Per HPI  Current Outpatient Medications:    ALPRAZolam (XANAX) 0.25 MG tablet, Take 1 tablet (0.25 mg total) by mouth 2 (two) times daily as needed for anxiety., Disp: 20 tablet, Rfl: 0   methylphenidate (METADATE CD) 10 MG CR capsule, Take 1 capsule (10 mg total) by mouth every morning., Disp: 30 capsule, Rfl:  0  Observations/Objective: Physical Exam Constitutional:      Appearance: Normal appearance.  Neurological:     General: No focal deficit present.     Mental Status: She is alert and oriented to person, place, and time. Mental status is at baseline.  Psychiatric:        Mood and Affect: Mood normal.        Behavior: Behavior normal.        Thought Content: Thought content normal.        Judgment: Judgment normal.     Assessment and Plan: Adult ADHD Assessment & Plan: Symptoms improving on Metadate CD '10mg'$  daily. She reports anxiety symptoms improving as well and only having taken the Xanax 0.'25mg'$  once. Continue current regimen and will follow up in 1 month.   Anxiety    Follow Up Instructions: Return in about 3 weeks (around 04/14/2022) for ADHD and anxiety.   I discussed the assessment and treatment plan with the patient. The patient was provided an opportunity to ask questions and all were answered. The patient agreed with the plan and demonstrated an understanding of the instructions.   The patient was advised to call back or seek an in-person evaluation if the symptoms worsen or if the condition fails to improve as anticipated.  The above assessment and management plan was discussed with the patient. The patient verbalized understanding of and has agreed to the management plan. Patient is aware to call the clinic if symptoms persist or worsen. Patient is aware when to return to the clinic for a follow-up visit. Patient educated  on when it is appropriate to go to the emergency department.   Time call ended: 1215  I provided 14 minutes of face-to-face time during this encounter.   Mila Merry, MSN, APRN, FNP-C [place]

## 2022-04-19 ENCOUNTER — Telehealth (INDEPENDENT_AMBULATORY_CARE_PROVIDER_SITE_OTHER): Payer: 59 | Admitting: Family Medicine

## 2022-04-19 DIAGNOSIS — F419 Anxiety disorder, unspecified: Secondary | ICD-10-CM | POA: Diagnosis not present

## 2022-04-19 DIAGNOSIS — F909 Attention-deficit hyperactivity disorder, unspecified type: Secondary | ICD-10-CM

## 2022-04-19 MED ORDER — METHYLPHENIDATE HCL ER (CD) 10 MG PO CPCR: 10.0000 mg | ORAL_CAPSULE | ORAL | 0 refills | Status: DC

## 2022-04-19 NOTE — Progress Notes (Signed)
Virtual Visit via Video note  I connected with Kathleen Osborne on 04/19/22 at 1100 by video and verified that I am speaking with the correct person using two identifiers. Shawnese Evalina Field is currently located at home and no one is currently with her during visit. The provider, Rubie Maid, FNP is located in their home at time of visit.  I discussed the limitations, risks, security and privacy concerns of performing an evaluation and management service by video and the availability of in person appointments. I also discussed with the patient that there may be a patient responsible charge related to this service. The patient expressed understanding and agreed to proceed.  Subjective: PCP: Rubie Maid, FNP  No chief complaint on file.   ADHD FOLLOW UP ADHD status: controlled Satisfied with current therapy: yes Medication compliance:  excellent compliance Controlled substance contract: yes Previous psychiatry evaluation: yes Previous medications: yes concerta   Taking meds on weekends/vacations: yes Work/school performance:  between jobs, [employer] of 5 children Difficulty sustaining attention/completing tasks: improved Distracted by extraneous stimuli: improved Does not listen when spoken to: no  Fidgets with hands or feet: yes Unable to stay in seat: yes Blurts out/interrupts others: improved ADHD Medication Side Effects: no    Decreased appetite: no    Headache: no    Sleeping disturbance pattern: no    Irritability: yes    Rebound effects (worse than baseline) off medication: no    Anxiousness: no, well controlled with PRN Xanax    Dizziness: no    Tics: no     ROS: Per HPI  Current Outpatient Medications:    ALPRAZolam (XANAX) 0.25 MG tablet, Take 1 tablet (0.25 mg total) by mouth 2 (two) times daily as needed for anxiety., Disp: 20 tablet, Rfl: 0   methylphenidate (METADATE CD) 10 MG CR capsule, Take 1 capsule (10 mg total) by mouth every morning., Disp: 90 capsule,  Rfl: 0  Observations/Objective: Physical Exam Constitutional:      Appearance: Normal appearance.  Eyes:     Conjunctiva/sclera: Conjunctivae normal.  Skin:    Coloration: Skin is not pale.  Neurological:     General: No focal deficit present.     Mental Status: She is alert and oriented to person, place, and time.  Psychiatric:        Mood and Affect: Mood normal.        Behavior: Behavior normal.        Thought Content: Thought content normal.        Judgment: Judgment normal.    Assessment and Plan: Adult ADHD Assessment & Plan: Well controlled on current regimen. Will continue Metadate CD '10mg'$  daily. 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   Anxiety Assessment & Plan: Well controlled with PRN Xanax 0.'25mg'$  BID. She reports only having used this twice over the last month. Will report to office if symptoms are worsening.   Other orders -     Methylphenidate HCl ER (CD); Take 1 capsule (10 mg total) by mouth every morning.  Dispense: 90 capsule; Refill: 0    Follow Up Instructions: Return in about 6 months (around 10/18/2022) for ADHD.   I discussed the assessment and treatment plan with the patient. The patient was provided an opportunity to ask questions and all were answered. The patient agreed with the plan and demonstrated an understanding of the instructions.  The patient was advised to call back or seek an in-person evaluation if the symptoms worsen or if the condition fails to improve as anticipated.  The above assessment and management plan was discussed with the patient. The patient verbalized understanding of and has agreed to the management plan. Patient is aware to call the clinic if symptoms persist or worsen. Patient is aware when to return to the clinic for a follow-up visit. Patient educated on when it is appropriate to go to the emergency department.    Time call ended: 1115  I provided 15 minutes of face-to-face time during this encounter.   Mila Merry, MSN, APRN, FNP-C [place]

## 2022-04-19 NOTE — Assessment & Plan Note (Addendum)
Well controlled with PRN Xanax 0.'25mg'$  BID. She reports only having used this twice over the last month. Will report to office if symptoms are worsening.

## 2022-04-19 NOTE — Assessment & Plan Note (Signed)
Well controlled on current regimen. Will continue Metadate CD '10mg'$  daily. 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

## 2022-04-20 ENCOUNTER — Other Ambulatory Visit: Payer: Self-pay | Admitting: Family Medicine

## 2022-04-20 ENCOUNTER — Encounter: Payer: Self-pay | Admitting: Family Medicine

## 2022-04-20 MED ORDER — METHYLPHENIDATE HCL ER (CD) 10 MG PO CPCR: 10.0000 mg | ORAL_CAPSULE | ORAL | 0 refills | Status: DC

## 2022-04-20 NOTE — Telephone Encounter (Signed)
Spoke w/pharmacy staff, cancel previous refill for xanax and metadate CD & resent it to [facility] per pt request.

## 2022-04-29 ENCOUNTER — Encounter: Payer: Self-pay | Admitting: Family Medicine

## 2022-05-02 ENCOUNTER — Other Ambulatory Visit: Payer: Self-pay | Admitting: Family Medicine

## 2022-05-02 MED ORDER — METHYLPHENIDATE HCL ER (CD) 10 MG PO CPCR: 10.0000 mg | ORAL_CAPSULE | ORAL | 0 refills | Status: DC

## 2022-05-16 ENCOUNTER — Telehealth: Payer: Self-pay

## 2022-05-16 NOTE — Telephone Encounter (Signed)
Joneen Roach ([employer]) PA Case ID #: [id number] Rx #: [id number] Need Help? Call us at [phone] Outcome N/A on February 14 Your PA request has been closed. Your ePA request has been aborted. No PA required. Pharmacy should process request. Maximum 30 day supply per plan allowance. Drug Methylphenidate HCl ER (CD) '10MG'$  er capsules ePA cloud Child psychotherapist Electronic PA Form [phone] NCPDP) Original Claim Info 76,19 IF REJ 76/78 CHECK QTY CALL [phone] DAYS SUPPLY OF 30(PHARMACY HELP DESK [phone])

## 2022-05-27 ENCOUNTER — Encounter: Payer: Self-pay | Admitting: Family Medicine

## 2022-05-30 ENCOUNTER — Other Ambulatory Visit: Payer: Self-pay | Admitting: Family Medicine

## 2022-05-30 MED ORDER — METHYLPHENIDATE HCL ER (CD) 10 MG PO CPCR: 10.0000 mg | ORAL_CAPSULE | ORAL | 0 refills | Status: DC

## 2022-05-30 MED ORDER — ALPRAZOLAM 0.25 MG PO TABS: 0.2500 mg | ORAL_TABLET | Freq: Two times a day (BID) | ORAL | 0 refills | Status: DC | PRN

## 2022-05-30 NOTE — Progress Notes (Signed)
PDMP reviewed and 1 month refill provided.

## 2022-07-03 ENCOUNTER — Other Ambulatory Visit: Payer: Self-pay | Admitting: Family Medicine

## 2022-07-04 MED ORDER — METHYLPHENIDATE HCL ER (CD) 10 MG PO CPCR: 10.0000 mg | ORAL_CAPSULE | ORAL | 0 refills | Status: DC

## 2022-08-01 ENCOUNTER — Ambulatory Visit: Payer: 59 | Admitting: Family Medicine

## 2022-08-01 ENCOUNTER — Encounter: Payer: Self-pay | Admitting: Family Medicine

## 2022-08-01 VITALS — BP 152/100 | HR 93 | Temp 97.9°F | Ht 62.0 in | Wt 265.0 lb

## 2022-08-01 DIAGNOSIS — R590 Localized enlarged lymph nodes: Secondary | ICD-10-CM

## 2022-08-01 MED ORDER — FLUCONAZOLE 150 MG PO TABS: 150.0000 mg | ORAL_TABLET | Freq: Once | ORAL | 0 refills | Status: AC

## 2022-08-01 MED ORDER — DOXYCYCLINE HYCLATE 100 MG PO TABS: 100.0000 mg | ORAL_TABLET | Freq: Two times a day (BID) | ORAL | 0 refills | Status: DC

## 2022-08-01 NOTE — Assessment & Plan Note (Addendum)
Submental lymphadenopathy on exam with small pimple adjacent on her chin. No systemic symptoms, recent illness, dental abscess, sores or lesion in her mouth. Will treat 150mg  tablet once for yeast infection to be filled only if she does have symptoms of a yeast infection given her history with antibiotic use.

## 2022-08-01 NOTE — Progress Notes (Signed)
   Acute Office Visit  Subjective:     Patient ID: [name], female    DOB: [birth year], 37 y.o.   MRN: [id number]  Chief Complaint  Patient presents with   Follow-up    swollen lymph nodes      HPI Patient is in today for midline neck pain for 24 hours. She report pain with talking and swallowing as well as swelling to her external throat. Denies fever, chills, body aches, recent illness, sore throat, mouth pain. Has tried Ibuprofen.  Review of Systems  All other systems reviewed and are negative.       Objective:    BP (!) 152/100   Pulse 93   Temp 97.9 F (36.6 C) (Oral)   Ht 5\' 2"  (1.575 m)   Wt 265 lb (120.2 kg)   LMP 07/18/2022 (Approximate)   SpO2 98%   BMI 48.47 kg/m    Physical Exam Vitals and nursing note reviewed.  Constitutional:      Appearance: Normal appearance. She is normal weight.  HENT:     Head: Normocephalic and atraumatic.  Lymphadenopathy:     Head:     Right side of head: Submental adenopathy present.     Left side of head: Submental adenopathy present.  Skin:    General: Skin is warm and dry.     Findings: Lesion present.       Neurological:     General: No focal deficit present.     Mental Status: She is alert and oriented to person, place, and time. Mental status is at baseline.  Psychiatric:        Mood and Affect: Mood normal.        Behavior: Behavior normal.        Thought Content: Thought content normal.        Judgment: Judgment normal.     No results found for any visits on 08/01/22.      Assessment & Plan:   Problem List Items Addressed This Visit     Submental lymphadenopathy - Primary    Submental lymphadenopathy on exam with small pimple adjacent on her chin. No systemic symptoms, recent illness, dental abscess, sores or lesion in her mouth. Will treat 150mg  tablet once for yeast infection to be filled only if she does have symptoms of a yeast infection given her history with antibiotic use.        Meds ordered this encounter  Medications   doxycycline (VIBRA-TABS) 100 MG tablet    Sig: Take 1 tablet (100 mg total) by mouth 2 (two) times daily.    Dispense:  10 tablet    Refill:  0    Order Specific Question:   Supervising Provider    Answer:   Lynnea Ferrier T [[id number]]   fluconazole (DIFLUCAN) 150 MG tablet    Sig: Take 1 tablet (150 mg total) by mouth once for 1 dose.    Dispense:  1 tablet    Refill:  0    Order Specific Question:   Supervising Provider    Answer:   Lynnea Ferrier T [[id number]]    Return if symptoms worsen or fail to improve.  Park Meo, FNP

## 2022-08-07 ENCOUNTER — Other Ambulatory Visit: Payer: Self-pay | Admitting: Family Medicine

## 2022-08-10 MED ORDER — METHYLPHENIDATE HCL ER (CD) 10 MG PO CPCR: 10.0000 mg | ORAL_CAPSULE | ORAL | 0 refills | Status: DC

## 2022-09-13 ENCOUNTER — Other Ambulatory Visit: Payer: Self-pay | Admitting: Family Medicine

## 2022-09-16 ENCOUNTER — Encounter: Payer: Self-pay | Admitting: Family Medicine

## 2022-11-02 ENCOUNTER — Other Ambulatory Visit: Payer: Self-pay | Admitting: Family Medicine

## 2022-11-02 ENCOUNTER — Encounter: Payer: Self-pay | Admitting: Family Medicine

## 2022-11-03 ENCOUNTER — Other Ambulatory Visit: Payer: Self-pay | Admitting: Family Medicine

## 2022-11-03 MED ORDER — METHYLPHENIDATE HCL ER (CD) 10 MG PO CPCR: 10.0000 mg | ORAL_CAPSULE | ORAL | 0 refills | Status: DC

## 2023-01-13 DIAGNOSIS — Z419 Encounter for procedure for purposes other than remedying health state, unspecified: Secondary | ICD-10-CM | POA: Diagnosis not present

## 2023-02-12 DIAGNOSIS — Z419 Encounter for procedure for purposes other than remedying health state, unspecified: Secondary | ICD-10-CM | POA: Diagnosis not present

## 2023-03-15 DIAGNOSIS — Z419 Encounter for procedure for purposes other than remedying health state, unspecified: Secondary | ICD-10-CM | POA: Diagnosis not present

## 2023-03-20 ENCOUNTER — Ambulatory Visit (INDEPENDENT_AMBULATORY_CARE_PROVIDER_SITE_OTHER): Payer: Medicaid Other | Admitting: Family Medicine

## 2023-03-20 ENCOUNTER — Encounter: Payer: Self-pay | Admitting: Family Medicine

## 2023-03-20 VITALS — BP 132/90 | HR 86 | Temp 98.1°F | Ht 62.0 in | Wt 245.0 lb

## 2023-03-20 DIAGNOSIS — N760 Acute vaginitis: Secondary | ICD-10-CM | POA: Diagnosis not present

## 2023-03-20 DIAGNOSIS — Z1159 Encounter for screening for other viral diseases: Secondary | ICD-10-CM

## 2023-03-20 DIAGNOSIS — B9689 Other specified bacterial agents as the cause of diseases classified elsewhere: Secondary | ICD-10-CM

## 2023-03-20 DIAGNOSIS — Z0000 Encounter for general adult medical examination without abnormal findings: Secondary | ICD-10-CM | POA: Diagnosis not present

## 2023-03-20 DIAGNOSIS — F909 Attention-deficit hyperactivity disorder, unspecified type: Secondary | ICD-10-CM | POA: Diagnosis not present

## 2023-03-20 DIAGNOSIS — Z13220 Encounter for screening for lipoid disorders: Secondary | ICD-10-CM

## 2023-03-20 DIAGNOSIS — Z1231 Encounter for screening mammogram for malignant neoplasm of breast: Secondary | ICD-10-CM

## 2023-03-20 DIAGNOSIS — Z0001 Encounter for general adult medical examination with abnormal findings: Secondary | ICD-10-CM

## 2023-03-20 DIAGNOSIS — Z124 Encounter for screening for malignant neoplasm of cervix: Secondary | ICD-10-CM

## 2023-03-20 DIAGNOSIS — Z6841 Body Mass Index (BMI) 40.0 and over, adult: Secondary | ICD-10-CM

## 2023-03-20 DIAGNOSIS — Z113 Encounter for screening for infections with a predominantly sexual mode of transmission: Secondary | ICD-10-CM

## 2023-03-20 DIAGNOSIS — F419 Anxiety disorder, unspecified: Secondary | ICD-10-CM | POA: Diagnosis not present

## 2023-03-20 LAB — WET PREP FOR TRICH, YEAST, CLUE

## 2023-03-20 MED ORDER — ALPRAZOLAM 0.25 MG PO TABS: 0.2500 mg | ORAL_TABLET | Freq: Every day | ORAL | 0 refills | Status: AC | PRN

## 2023-03-20 NOTE — Assessment & Plan Note (Signed)
 Continue Tirzepatide as prescribed in addition to reduced calorie diet and exercise.Counseled on importance of weight management for overall health. Encouraged low calorie, heart healthy diet and moderate intensity exercise 150 minutes weekly. This is 3-5 times weekly for 30-50 minutes each session. Goal should be pace of 3 miles/hours, or walking 1.5 miles in 30 minutes and include strength training. Discussed risks of obesity. Will refer to [facility].

## 2023-03-20 NOTE — Progress Notes (Signed)
 Subjective:   Kathleen Osborne is a 38 y.o. female for annual routine Pap and checkup. She is working on weight loss and has started Tirzepatide 2.5mg  weekly since August 6 and  is tolerating well. She has lost 20 pounds. Has also started [employer] today. She consumes 2000 calories and goal of 90g protein daily. She does see a behavioral health specialist.  Current Outpatient Medications  Medication Sig Dispense Refill   ALPRAZolam  (XANAX ) 0.25 MG tablet Take 1 tablet (0.25 mg total) by mouth daily as needed for anxiety. 20 tablet 0   No current facility-administered medications for this visit.   Allergies: Patient has no known allergies.  Patient's last menstrual period was 03/11/2023 (approximate). Past Medical History:  Diagnosis Date   Anxiety    no meds while pregnant   Complication of anesthesia    very anxious on emergence   Depression    Family history of breast cancer    Family history of melanoma    Heartburn in pregnancy    Monoallelic mutation of CHEK2 gene    Past Surgical History:  Procedure Laterality Date   CERVICAL CONE BIOPSY     2006   CESAREAN SECTION     NR[facility] 1st Csection.   CESAREAN SECTION N/A 10/24/2012   Procedure: REPEAT CESAREAN SECTION;  Surgeon: Carlin DELENA Centers, MD;  Location: [facility];  Service: Obstetrics;  Laterality: N/A;   DILITATION & CURRETTAGE/HYSTROSCOPY WITH HYDROTHERMAL ABLATION N/A 03/06/2019   Procedure: DILATATION & CURETTAGE/HYSTEROSCOPY WITH HYDROTHERMAL ABLATION;  Surgeon: Alger Gong, MD;  Location: [facility];  Service: Gynecology;  Laterality: N/A;   LAPAROSCOPIC BILATERAL SALPINGECTOMY Bilateral 02/26/2021   Procedure: LAPAROSCOPIC BILATERAL SALPINGECTOMY WITH REMOVAL OF ECTOPIC PREGNANCY;  Surgeon: Ozan, Jennifer, DO;  Location: [facility];  Service: Gynecology;  Laterality: Bilateral;   LEEP     TONSILLECTOMY AND ADENOIDECTOMY Bilateral 08/22/2017   Procedure: TONSILLECTOMY AND ADENOIDECTOMY;  Surgeon: Karis Clunes, MD;  Location: [facility];  Service: ENT;  Laterality: Bilateral;     ROS:  Feeling well. No dyspnea or chest pain on exertion.  No abdominal pain, change in bowel habits, black or bloody stools.  No urinary tract symptoms. GYN ROS: normal menses, no abnormal bleeding, pelvic pain or discharge, no breast pain or new or enlarging lumps on self exam. No neurological complaints.  Objective:   The patient appears well, alert, oriented x 3, in no distress. BP (!) 132/90   Pulse 86   Temp 98.1 F (36.7 C) (Oral)   Ht 5' 2 (1.575 m)   Wt 245 lb (111.1 kg)   LMP 03/11/2023 (Approximate)   SpO2 97%   BMI 44.81 kg/m  ENT normal.  Neck supple. No adenopathy or thyromegaly. PERLA. Lungs are clear, good air entry, no wheezes, rhonchi or rales. S1 and S2 normal, no murmurs, regular rate and rhythm. Abdomen soft without tenderness, guarding, mass or organomegaly. Extremities show no edema, normal peripheral pulses. Neurological is normal, no focal findings.  BREAST EXAM: breasts appear normal, no suspicious masses, no skin or nipple changes or axillary nodes  PELVIC EXAM: normal external genitalia, vulva, vagina, cervix, uterus and adnexa, WET MOUNT done - results: clue cells, excessive bacteria, white blood cells  Assessment & Plan:   well woman  PLAN:  mammogram pap smear additional lab tests per orders return annually or prn    Physical exam, annual Assessment & Plan: Today your medical history was reviewed and routine physical exam with labs  was performed. Recommend 150 minutes of moderate intensity exercise weekly and consuming a well-balanced diet. Advised to stop smoking if a smoker, avoid smoking if a non-smoker, limit alcohol consumption to 1 drink per day for women and 2 drinks per day for men, and avoid illicit drug use. Counseled on safe sex practices and offered STI testing today. Counseled in mental health awareness and when to seek medical care. Vaccine  maintenance discussed. Appropriate health maintenance items reviewed. Return to office in 1 year for annual physical exam.   Orders: -     CBC with Differential/Platelet -     COMPLETE METABOLIC PANEL WITH GFR -     Lipid panel -     TSH -     Hepatitis C antibody -     Digital Screening Mammogram, Left and Right; Future -     Pap, TP Imaging w/ CT/GC and w/ HPV RNA, rflx HPV Type 16/18 -     WET PREP FOR TRICH, YEAST, CLUE  Encounter for screening mammogram for malignant neoplasm of breast -     Digital Screening Mammogram, Left and Right; Future  Cervical cancer screening -     Pap, TP Imaging w/ CT/GC and w/ HPV RNA, rflx HPV Type 16/18 -     WET PREP FOR TRICH, YEAST, CLUE  Need for hepatitis C screening test -     Hepatitis C antibody  Screening for lipoid disorders -     Lipid panel  Morbid obesity with BMI of 45.0-49.9, adult (HCC) Assessment & Plan: Continue Tirzepatide as prescribed in addition to reduced calorie diet and exercise.Counseled on importance of weight management for overall health. Encouraged low calorie, heart healthy diet and moderate intensity exercise 150 minutes weekly. This is 3-5 times weekly for 30-50 minutes each session. Goal should be pace of 3 miles/hours, or walking 1.5 miles in 30 minutes and include strength training. Discussed risks of obesity. Will refer to [facility].   Orders: -     CBC with Differential/Platelet -     COMPLETE METABOLIC PANEL WITH GFR -     TSH -     Amb Ref to Medical Weight Management  Routine screening for STI (sexually transmitted infection) -     WET PREP FOR TRICH, YEAST, CLUE  Adult ADHD Assessment & Plan: Chronic. Would like psychiatry referral to discuss treatment options.  Orders: -     Ambulatory referral to Psychiatry  Anxiety Assessment & Plan: Well controlled with PRN Xanax  0.25mg  BID. She reports only having used this twice over the last month. Will report to office if symptoms are  worsening.  Orders: -     Ambulatory referral to Psychiatry  Bacterial vaginosis Assessment & Plan: Asymptomatic. She declines treatment at this time.    Other orders -     ALPRAZolam ; Take 1 tablet (0.25 mg total) by mouth daily as needed for anxiety.  Dispense: 20 tablet; Refill: 0     Follow up plan: Return in about 1 year (around 03/19/2024) for annual physical with labs 1 week prior.  Jeoffrey GORMAN Barrio, FNP

## 2023-03-20 NOTE — Assessment & Plan Note (Signed)
 Asymptomatic. She declines treatment at this time.

## 2023-03-20 NOTE — Assessment & Plan Note (Signed)
 Chronic. Would like psychiatry referral to discuss treatment options.

## 2023-03-20 NOTE — Assessment & Plan Note (Signed)
Well controlled with PRN Xanax 0.'25mg'$  BID. She reports only having used this twice over the last month. Will report to office if symptoms are worsening.

## 2023-03-20 NOTE — Assessment & Plan Note (Signed)
 Today your medical history was reviewed and routine physical exam with labs was performed. Recommend 150 minutes of moderate intensity exercise weekly and consuming a well-balanced diet. Advised to stop smoking if a smoker, avoid smoking if a non-smoker, limit alcohol consumption to 1 drink per day for women and 2 drinks per day for men, and avoid illicit drug use. Counseled on safe sex practices and offered STI testing today. Counseled in mental health awareness and when to seek medical care. Vaccine maintenance discussed. Appropriate health maintenance items reviewed. Return to office in 1 year for annual physical exam.

## 2023-03-21 ENCOUNTER — Other Ambulatory Visit: Payer: Self-pay

## 2023-03-21 DIAGNOSIS — Z808 Family history of malignant neoplasm of other organs or systems: Secondary | ICD-10-CM

## 2023-03-22 ENCOUNTER — Other Ambulatory Visit: Payer: Medicaid Other

## 2023-03-23 ENCOUNTER — Encounter: Payer: Self-pay | Admitting: Family Medicine

## 2023-03-23 LAB — PAP, TP IMAGING W/ HPV RNA, RFLX HPV TYPE 16,18/45: HPV DNA High Risk: NOT DETECTED

## 2023-03-23 LAB — PAP, TP IMAGING W/ CT/GC AND W/ HPV RNA, RFLX HPV TYPE 16/18

## 2023-03-23 LAB — C. TRACHOMATIS/N. GONORRHOEAE RNA
C. trachomatis RNA, TMA: NOT DETECTED
N. gonorrhoeae RNA, TMA: NOT DETECTED

## 2023-03-24 ENCOUNTER — Other Ambulatory Visit: Payer: Medicaid Other

## 2023-03-24 DIAGNOSIS — Z6841 Body Mass Index (BMI) 40.0 and over, adult: Secondary | ICD-10-CM | POA: Diagnosis not present

## 2023-03-24 DIAGNOSIS — Z1159 Encounter for screening for other viral diseases: Secondary | ICD-10-CM | POA: Diagnosis not present

## 2023-03-24 DIAGNOSIS — Z0000 Encounter for general adult medical examination without abnormal findings: Secondary | ICD-10-CM | POA: Diagnosis not present

## 2023-03-24 DIAGNOSIS — Z13220 Encounter for screening for lipoid disorders: Secondary | ICD-10-CM | POA: Diagnosis not present

## 2023-03-25 LAB — CBC WITH DIFFERENTIAL/PLATELET
Absolute Lymphocytes: 2636 {cells}/uL (ref 850–3900)
Absolute Monocytes: 578 {cells}/uL (ref 200–950)
Basophils Absolute: 42 {cells}/uL (ref 0–200)
Basophils Relative: 0.4 %
Eosinophils Absolute: 210 {cells}/uL (ref 15–500)
Eosinophils Relative: 2 %
HCT: 40.2 % (ref 35.0–45.0)
Hemoglobin: 12.9 g/dL (ref 11.7–15.5)
MCH: 26.5 pg — ABNORMAL LOW (ref 27.0–33.0)
MCHC: 32.1 g/dL (ref 32.0–36.0)
MCV: 82.7 fL (ref 80.0–100.0)
MPV: 10.4 fL (ref 7.5–12.5)
Monocytes Relative: 5.5 %
Neutro Abs: 7035 {cells}/uL (ref 1500–7800)
Neutrophils Relative %: 67 %
Platelets: 372 10*3/uL (ref 140–400)
RBC: 4.86 10*6/uL (ref 3.80–5.10)
RDW: 13.6 % (ref 11.0–15.0)
Total Lymphocyte: 25.1 %
WBC: 10.5 10*3/uL (ref 3.8–10.8)

## 2023-03-25 LAB — COMPLETE METABOLIC PANEL WITH GFR
AG Ratio: 1.8 (calc) (ref 1.0–2.5)
ALT: 24 U/L (ref 6–29)
AST: 18 U/L (ref 10–30)
Albumin: 4.1 g/dL (ref 3.6–5.1)
Alkaline phosphatase (APISO): 70 U/L (ref 31–125)
BUN: 7 mg/dL (ref 7–25)
CO2: 23 mmol/L (ref 20–32)
Calcium: 9 mg/dL (ref 8.6–10.2)
Chloride: 104 mmol/L (ref 98–110)
Creat: 0.65 mg/dL (ref 0.50–0.97)
Globulin: 2.3 g/dL (ref 1.9–3.7)
Glucose, Bld: 80 mg/dL (ref 65–99)
Potassium: 4.5 mmol/L (ref 3.5–5.3)
Sodium: 139 mmol/L (ref 135–146)
Total Bilirubin: 0.4 mg/dL (ref 0.2–1.2)
Total Protein: 6.4 g/dL (ref 6.1–8.1)
eGFR: 117 mL/min/{1.73_m2} (ref >=60)

## 2023-03-25 LAB — LIPID PANEL
Cholesterol: 190 mg/dL (ref <200)
HDL: 47 mg/dL — ABNORMAL LOW (ref >=50)
LDL Cholesterol (Calc): 119 mg/dL — ABNORMAL HIGH
Non-HDL Cholesterol (Calc): 143 mg/dL — ABNORMAL HIGH (ref <130)
Total CHOL/HDL Ratio: 4 (calc) (ref <5.0)
Triglycerides: 127 mg/dL (ref <150)

## 2023-03-25 LAB — TSH: TSH: 1.07 m[IU]/L

## 2023-03-25 LAB — HEPATITIS C ANTIBODY: Hepatitis C Ab: NONREACTIVE

## 2023-04-11 ENCOUNTER — Encounter: Payer: 59 | Admitting: Family Medicine

## 2023-04-15 DIAGNOSIS — Z419 Encounter for procedure for purposes other than remedying health state, unspecified: Secondary | ICD-10-CM | POA: Diagnosis not present

## 2023-04-17 ENCOUNTER — Ambulatory Visit
Admission: RE | Admit: 2023-04-17 | Discharge: 2023-04-17 | Disposition: A | Discharge status: Home / Self Care | Payer: Medicaid Other | Source: Ambulatory Visit | Attending: Family Medicine | Admitting: Family Medicine

## 2023-04-17 DIAGNOSIS — Z0000 Encounter for general adult medical examination without abnormal findings: Secondary | ICD-10-CM

## 2023-04-17 DIAGNOSIS — Z1231 Encounter for screening mammogram for malignant neoplasm of breast: Secondary | ICD-10-CM

## 2023-04-24 ENCOUNTER — Encounter: Payer: Self-pay | Admitting: Dermatology

## 2023-04-24 ENCOUNTER — Ambulatory Visit (INDEPENDENT_AMBULATORY_CARE_PROVIDER_SITE_OTHER): Payer: Medicaid Other | Admitting: Dermatology

## 2023-04-24 VITALS — BP 158/91 | HR 83

## 2023-04-24 DIAGNOSIS — L080 Pyoderma: Secondary | ICD-10-CM | POA: Diagnosis not present

## 2023-04-24 DIAGNOSIS — Z808 Family history of malignant neoplasm of other organs or systems: Secondary | ICD-10-CM | POA: Diagnosis not present

## 2023-04-24 DIAGNOSIS — L821 Other seborrheic keratosis: Secondary | ICD-10-CM | POA: Diagnosis not present

## 2023-04-24 DIAGNOSIS — Z1283 Encounter for screening for malignant neoplasm of skin: Secondary | ICD-10-CM | POA: Diagnosis not present

## 2023-04-24 DIAGNOSIS — L578 Other skin changes due to chronic exposure to nonionizing radiation: Secondary | ICD-10-CM | POA: Diagnosis not present

## 2023-04-24 DIAGNOSIS — D229 Melanocytic nevi, unspecified: Secondary | ICD-10-CM

## 2023-04-24 DIAGNOSIS — W908XXA Exposure to other nonionizing radiation, initial encounter: Secondary | ICD-10-CM

## 2023-04-24 DIAGNOSIS — D492 Neoplasm of unspecified behavior of bone, soft tissue, and skin: Secondary | ICD-10-CM | POA: Diagnosis not present

## 2023-04-24 DIAGNOSIS — D485 Neoplasm of uncertain behavior of skin: Secondary | ICD-10-CM

## 2023-04-24 DIAGNOSIS — D1801 Hemangioma of skin and subcutaneous tissue: Secondary | ICD-10-CM | POA: Diagnosis not present

## 2023-04-24 DIAGNOSIS — L814 Other melanin hyperpigmentation: Secondary | ICD-10-CM | POA: Diagnosis not present

## 2023-04-24 NOTE — Progress Notes (Signed)
   New Patient Visit   Subjective  Kathleen Osborne is a 38 y.o. female who presents for the following: Skin Cancer Screening and Full Body Skin Exam.  No hx of skin cancer. Family hx of MM (mother passed away of melanoma age 24) and patient's sister had a melanoma at age 82. Patient had genetic testing for breast cancer risk.   The patient presents for Total-Body Skin Exam (TBSE) for skin cancer screening and mole check. The patient has spots, moles and lesions to be evaluated, some may be new or changing.  The following portions of the chart were reviewed this encounter and updated as appropriate: medications, allergies, medical history  Review of Systems:  No other skin or systemic complaints except as noted in HPI or Assessment and Plan.  Objective  Well appearing patient in no apparent distress; mood and affect are within normal limits.  A full examination was performed including scalp, head, eyes, ears, nose, lips, neck, chest, axillae, abdomen, back, buttocks, bilateral upper extremities, bilateral lower extremities, hands, feet, fingers, toes, fingernails, and toenails. All findings within normal limits unless otherwise noted below.   Relevant physical exam findings are noted in the Assessment and Plan.  Right shoulder 0.4 mm pink papule   Assessment & Plan   SKIN CANCER SCREENING PERFORMED TODAY.  ACTINIC DAMAGE - Chronic condition, secondary to cumulative UV/sun exposure - diffuse scaly erythematous macules with underlying dyspigmentation - Recommend daily broad spectrum sunscreen SPF 30+ to sun-exposed areas, reapply every 2 hours as needed.  - Staying in the shade or wearing long sleeves, sun glasses (UVA+UVB protection) and wide brim hats (4-inch brim around the entire circumference of the hat) are also recommended for sun protection.  - Call for new or changing lesions.  LENTIGINES, SEBORRHEIC KERATOSES, HEMANGIOMAS - Benign normal skin lesions - Benign-appearing -  Call for any changes  MELANOCYTIC NEVI - Tan-brown and/or pink-flesh-colored symmetric macules and papules - Benign appearing on exam today - Observation - Call clinic for new or changing moles - Recommend daily use of broad spectrum spf 30+ sunscreen to sun-exposed areas.   NEOPLASM OF UNCERTAIN BEHAVIOR OF SKIN Right shoulder Skin / nail biopsy Type of biopsy: tangential   Informed consent: discussed and consent obtained   Timeout: patient name, date of birth, surgical site, and procedure verified   Procedure prep:  Patient was prepped and draped in usual sterile fashion Prep type:  Isopropyl alcohol Anesthesia: the lesion was anesthetized in a standard fashion   Anesthetic:  1% lidocaine  w/ epinephrine 1-100,000 buffered w/ 8.4% NaHCO3 Instrument used: DermaBlade   Hemostasis achieved with: aluminum chloride   Outcome: patient tolerated procedure well   Post-procedure details: sterile dressing applied and wound care instructions given   Dressing type: bandage and petrolatum    Specimen 1 - Surgical pathology Differential Diagnosis: r/o NMSC vs nevus vs DF  Check Margins: No ACTINIC SKIN DAMAGE   LENTIGINES   SEBORRHEIC KERATOSES   CHERRY ANGIOMA   MULTIPLE BENIGN NEVI    Return in about 1 year (around 04/23/2024) for TBSC.  Phebe Brasil, Surg Tech III, am acting as scribe for Deneise Finlay, MD.   Documentation: I have reviewed the above documentation for accuracy and completeness, and I agree with the above.  Deneise Finlay, MD

## 2023-04-24 NOTE — Patient Instructions (Addendum)
 Important Information  Due to recent changes in healthcare laws, you may see results of your pathology and/or laboratory studies on MyChart before the doctors have had a chance to review them. We understand that in some cases there may be results that are confusing or concerning to you. Please understand that not all results are received at the same time and often the doctors may need to interpret multiple results in order to provide you with the best plan of care or course of treatment. Therefore, we ask that you please give us  2 business days to thoroughly review all your results before contacting the office for clarification. Should we see a critical lab result, you will be contacted sooner.   If You Need Anything After Your Visit  If you have any questions or concerns for your doctor, please call our main line at [phone] If no one answers, please leave a voicemail as directed and we will return your call as soon as possible. Messages left after 4 pm will be answered the following business day.   You may also send us  a message via MyChart. We typically respond to MyChart messages within 1-2 business days.  For prescription refills, please ask your pharmacy to contact our office. Our fax number is [phone].  If you have an urgent issue when the clinic is closed that cannot wait until the next business day, you can page your doctor at the number below.    Please note that while we do our best to be available for urgent issues outside of office hours, we are not available 24/7.   If you have an urgent issue and are unable to reach us , you may choose to seek medical care at your doctor's office, retail clinic, urgent care center, or emergency room.  If you have a medical emergency, please immediately call 911 or go to the emergency department. In the event of inclement weather, please call our main line at [phone] for an update on the status of any delays or  closures.  Dermatology Medication Tips: Please keep the boxes that topical medications come in in order to help keep track of the instructions about where and how to use these. Pharmacies typically print the medication instructions only on the boxes and not directly on the medication tubes.   If your medication is too expensive, please contact our office at [phone] or send us  a message through MyChart.   We are unable to tell what your co-pay for medications will be in advance as this is different depending on your insurance coverage. However, we may be able to find a substitute medication at lower cost or fill out paperwork to get insurance to cover a needed medication.   If a prior authorization is required to get your medication covered by your insurance company, please allow us  1-2 business days to complete this process.  Drug prices often vary depending on where the prescription is filled and some pharmacies may offer cheaper prices.  The website www.goodrx.com contains coupons for medications through different pharmacies. The prices here do not account for what the cost may be with help from insurance (it may be cheaper with your insurance), but the website can give you the price if you did not use any insurance.  - You can print the associated coupon and take it with your prescription to the pharmacy.  - You may also stop by our office during regular business hours and pick up a GoodRx coupon card.  - If  you need your prescription sent electronically to a different pharmacy, notify our office through [facility] or by phone at [phone]     Patient Handout: Wound Care for Skin Biopsy Site  Taking Care of Your Skin Biopsy Site  Proper care of the biopsy site is essential for promoting healing and minimizing scarring. This handout provides instructions on how to care for your biopsy site to ensure optimal recovery.  1. Cleaning the Wound:  Clean the biopsy site daily  with gentle soap and water. Gently pat the area dry with a clean, soft towel. Avoid harsh scrubbing or rubbing the area, as this can irritate the skin and delay healing.  2. Applying Aquaphor and Bandage:  After cleaning the wound, apply a thin layer of Aquaphor ointment to the biopsy site. Cover the area with a sterile bandage to protect it from dirt, bacteria, and friction. Change the bandage daily or as needed if it becomes soiled or wet.  3. Continued Care for One Week:  Repeat the cleaning, Aquaphor application, and bandaging process daily for one week following the biopsy procedure. Keeping the wound clean and moist during this initial healing period will help prevent infection and promote optimal healing.  4. Massaging Aquaphor into the Area:  ---After one week, discontinue the use of bandages but continue to apply Aquaphor to the biopsy site. ----Gently massage the Aquaphor into the area using circular motions. ---Massaging the skin helps to promote circulation and prevent the formation of scar tissue.   Additional Tips:  Avoid exposing the biopsy site to direct sunlight during the healing process, as this can cause hyperpigmentation or worsen scarring. If you experience any signs of infection, such as increased redness, swelling, warmth, or drainage from the wound, contact your healthcare provider immediately. Follow any additional instructions provided by your healthcare provider for caring for the biopsy site and managing any discomfort. Conclusion:  Taking proper care of your skin biopsy site is crucial for ensuring optimal healing and minimizing scarring. By following these instructions for cleaning, applying Aquaphor, and massaging the area, you can promote a smooth and successful recovery. If you have any questions or concerns about caring for your biopsy site, don't hesitate to contact your healthcare provider for guidance.   Patient Handout: Wound Care for Skin Biopsy  Site  Taking Care of Your Skin Biopsy Site  Proper care of the biopsy site is essential for promoting healing and minimizing scarring. This handout provides instructions on how to care for your biopsy site to ensure optimal recovery.  1. Cleaning the Wound:  Clean the biopsy site daily with gentle soap and water. Gently pat the area dry with a clean, soft towel. Avoid harsh scrubbing or rubbing the area, as this can irritate the skin and delay healing.  2. Applying Aquaphor and Bandage:  After cleaning the wound, apply a thin layer of Aquaphor ointment to the biopsy site. Cover the area with a sterile bandage to protect it from dirt, bacteria, and friction. Change the bandage daily or as needed if it becomes soiled or wet.  3. Continued Care for One Week:  Repeat the cleaning, Aquaphor application, and bandaging process daily for one week following the biopsy procedure. Keeping the wound clean and moist during this initial healing period will help prevent infection and promote optimal healing.  4. Massaging Aquaphor into the Area:  ---After one week, discontinue the use of bandages but continue to apply Aquaphor to the biopsy site. ----Gently massage the Aquaphor  into the area using circular motions. ---Massaging the skin helps to promote circulation and prevent the formation of scar tissue.   Additional Tips:  Avoid exposing the biopsy site to direct sunlight during the healing process, as this can cause hyperpigmentation or worsen scarring. If you experience any signs of infection, such as increased redness, swelling, warmth, or drainage from the wound, contact your healthcare provider immediately. Follow any additional instructions provided by your healthcare provider for caring for the biopsy site and managing any discomfort. Conclusion:  Taking proper care of your skin biopsy site is crucial for ensuring optimal healing and minimizing scarring. By following these  instructions for cleaning, applying Aquaphor, and massaging the area, you can promote a smooth and successful recovery. If you have any questions or concerns about caring for your biopsy site, don't hesitate to contact your healthcare provider for guidance.

## 2023-04-27 LAB — SURGICAL PATHOLOGY

## 2023-05-09 ENCOUNTER — Encounter (INDEPENDENT_AMBULATORY_CARE_PROVIDER_SITE_OTHER): Payer: Self-pay

## 2023-05-10 ENCOUNTER — Encounter (HOSPITAL_COMMUNITY): Payer: Self-pay
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
## 2023-05-13 DIAGNOSIS — Z419 Encounter for procedure for purposes other than remedying health state, unspecified: Secondary | ICD-10-CM | POA: Diagnosis not present

## 2023-05-15 ENCOUNTER — Ambulatory Visit (INDEPENDENT_AMBULATORY_CARE_PROVIDER_SITE_OTHER): Payer: Medicaid Other | Admitting: Psychiatry

## 2023-05-15 ENCOUNTER — Encounter: Payer: Self-pay | Admitting: Dermatology

## 2023-05-15 ENCOUNTER — Encounter (HOSPITAL_COMMUNITY): Payer: Self-pay | Admitting: Psychiatry

## 2023-05-15 DIAGNOSIS — F902 Attention-deficit hyperactivity disorder, combined type: Secondary | ICD-10-CM | POA: Insufficient documentation

## 2023-05-15 DIAGNOSIS — Z8659 Personal history of other mental and behavioral disorders: Secondary | ICD-10-CM | POA: Diagnosis not present

## 2023-05-15 DIAGNOSIS — Z8759 Personal history of other complications of pregnancy, childbirth and the puerperium: Secondary | ICD-10-CM

## 2023-05-15 DIAGNOSIS — F419 Anxiety disorder, unspecified: Secondary | ICD-10-CM | POA: Insufficient documentation

## 2023-05-15 DIAGNOSIS — F411 Generalized anxiety disorder: Secondary | ICD-10-CM | POA: Diagnosis not present

## 2023-05-15 NOTE — Patient Instructions (Signed)
 Thank you for attending your appointment today.  -- We did not make any medication changes today. Please continue medications as prescribed. -- Please follow up with your primary care for management of elevated blood pressure as we consider medication options for ADHD  Please do not make any changes to medications without first discussing with your provider. If you are experiencing a psychiatric emergency, please call 911 or present to your nearest emergency department. Additional crisis, medication management, and therapy resources are included below.  [facility]  [address] [place], [place] [address] [phone] WALK-IN URGENT CARE 24/7 FOR ANYONE [address] [place], [place]  [phone] Fax: [phone] [email or web address] *Interpreters available *Accepts all insurance and uninsured for Urgent Care needs *Accepts Medicaid and uninsured for outpatient treatment (below)      ONLY FOR [facility]  Below:    Outpatient New Patient Assessment/Therapy Walk-ins:        Monday, Wednesday, and Thursday 8am until slots are full (first come, first served)                   New Patient Psychiatry/Medication Management        Monday-Friday 8am-11am (first come, first served)               For all walk-ins we ask that you arrive by 7:15am, because patients will be seen in the order of arrival.

## 2023-05-17 ENCOUNTER — Ambulatory Visit: Payer: Medicaid Other | Admitting: Dermatology

## 2023-05-18 ENCOUNTER — Ambulatory Visit (HOSPITAL_COMMUNITY): Payer: Self-pay | Admitting: Licensed Clinical Social Worker

## 2023-05-18 ENCOUNTER — Encounter (HOSPITAL_COMMUNITY): Payer: Self-pay | Admitting: Licensed Clinical Social Worker

## 2023-05-18 DIAGNOSIS — F411 Generalized anxiety disorder: Secondary | ICD-10-CM
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
## 2023-06-05 ENCOUNTER — Encounter (HOSPITAL_COMMUNITY): Payer: Self-pay

## 2023-06-14 ENCOUNTER — Ambulatory Visit (HOSPITAL_COMMUNITY): Admitting: Licensed Clinical Social Worker

## 2023-06-24 DIAGNOSIS — Z419 Encounter for procedure for purposes other than remedying health state, unspecified: Secondary | ICD-10-CM | POA: Diagnosis not present
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
## 2023-06-30 ENCOUNTER — Encounter (HOSPITAL_COMMUNITY): Payer: Self-pay | Admitting: Psychiatry

## 2023-06-30 ENCOUNTER — Telehealth (HOSPITAL_COMMUNITY): Admitting: Psychiatry

## 2023-06-30 DIAGNOSIS — F902 Attention-deficit hyperactivity disorder, combined type: Secondary | ICD-10-CM | POA: Diagnosis not present

## 2023-06-30 DIAGNOSIS — Z8659 Personal history of other mental and behavioral disorders: Secondary | ICD-10-CM

## 2023-06-30 DIAGNOSIS — Z8759 Personal history of other complications of pregnancy, childbirth and the puerperium: Secondary | ICD-10-CM | POA: Diagnosis not present

## 2023-06-30 DIAGNOSIS — F419 Anxiety disorder, unspecified: Secondary | ICD-10-CM

## 2023-06-30 MED ORDER — GUANFACINE HCL ER 1 MG PO TB24: 1.0000 mg | ORAL_TABLET | Freq: Every evening | ORAL | 2 refills | Status: AC

## 2023-06-30 NOTE — Patient Instructions (Signed)
 Thank you for attending your appointment today.  -- START guanfacine  1 mg nightly -- Continue other medications as prescribed. -- A great resource for finding a therapist is called [email or web address] in which you can search for therapists by specialty, insurance, location, etc.  Please do not make any changes to medications without first discussing with your provider. If you are experiencing a psychiatric emergency, please call 911 or present to your nearest emergency department. Additional crisis, medication management, and therapy resources are included below.  [facility]  [address], [place], [place] [address] [phone] WALK-IN URGENT CARE 24/7 FOR ANYONE [address] [place], [place]  [phone] Fax: [phone] [email or web address] *Interpreters available *Accepts all insurance and uninsured for Urgent Care needs *Accepts Medicaid and uninsured for outpatient treatment (below)      ONLY FOR [facility]  Below:    Outpatient New Patient Assessment/Therapy Walk-ins:        Monday, Wednesday, and Thursday 8am until slots are full (first come, first served)                   New Patient Psychiatry/Medication Management        Monday-Friday 8am-11am (first come, first served)               For all walk-ins we ask that you arrive by 7:15am, because patients will be seen in the order of arrival.

## 2023-07-06 ENCOUNTER — Ambulatory Visit (HOSPITAL_COMMUNITY): Admitting: Licensed Clinical Social Worker

## 2023-07-19 ENCOUNTER — Ambulatory Visit: Admitting: Dermatology

## 2023-07-24 DIAGNOSIS — Z419 Encounter for procedure for purposes other than remedying health state, unspecified: Secondary | ICD-10-CM | POA: Diagnosis not present

## 2023-08-09 ENCOUNTER — Other Ambulatory Visit (HOSPITAL_COMMUNITY): Payer: Self-pay | Admitting: Psychiatry

## 2023-08-24 DIAGNOSIS — Z419 Encounter for procedure for purposes other than remedying health state, unspecified: Secondary | ICD-10-CM | POA: Diagnosis not present
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
## 2023-09-04 ENCOUNTER — Telehealth (HOSPITAL_COMMUNITY): Admitting: Psychiatry

## 2023-09-04 ENCOUNTER — Encounter (HOSPITAL_COMMUNITY): Payer: Self-pay

## 2023-09-23 DIAGNOSIS — Z419 Encounter for procedure for purposes other than remedying health state, unspecified: Secondary | ICD-10-CM | POA: Diagnosis not present

## 2023-09-25 ENCOUNTER — Telehealth (HOSPITAL_COMMUNITY): Payer: Self-pay

## 2023-09-25 NOTE — Telephone Encounter (Signed)
 When calling PT to reschedule 7/21 appointment - PT became upset stating  I didn't schedule that and no one called me back and I had called but I didn't leave a message and no one would answer the phone on Monday. I informed the PT that her and I actually spoke on Monday and that a message was sent to her provider regarding why the appointment was being missed.. The PT then began arguing over the phone - I asked the PT if we could still get the 7/21 appointment rescheduled and PT stated  fine  PT took 9/4 appointment

## 2023-10-02 ENCOUNTER — Telehealth (HOSPITAL_COMMUNITY): Admitting: Psychiatry

## 2023-10-11 IMAGING — US US OB < 14 WEEKS - US OB TV
1 series · 15 of 28 positions shown · non-contrast
Comparison: 12/28/2020

CLINICAL DATA: Pregnant, encounter to assess fetal viability; LMP
indicates started having spotting/bleeding on 01/01/2021

EXAM:
OBSTETRIC <14 WK US AND TRANSVAGINAL OB US
TECHNIQUE: Both transabdominal and transvaginal ultrasound examinations were
performed for complete evaluation of the gestation as well as the
maternal uterus, adnexal regions, and pelvic cul-de-sac.
Transvaginal technique was performed to assess early pregnancy.

[Series 1: us ob < 14 weeks - us ob tv · 15 of 82 slices shown]
[im 1/82]
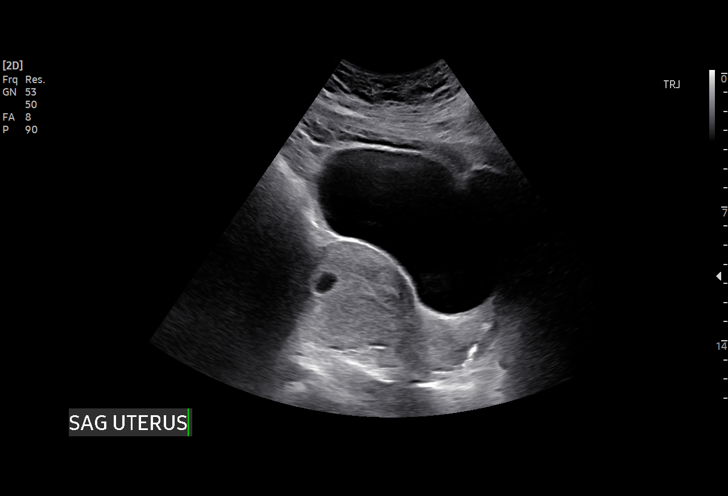
[im 7/82]
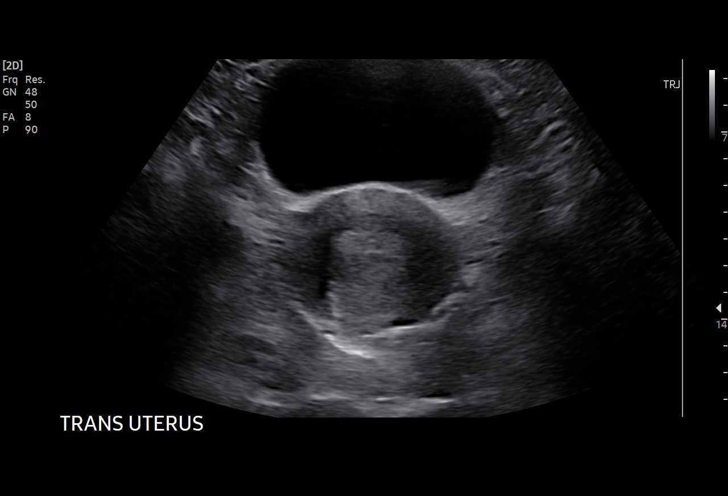
[im 13/82]
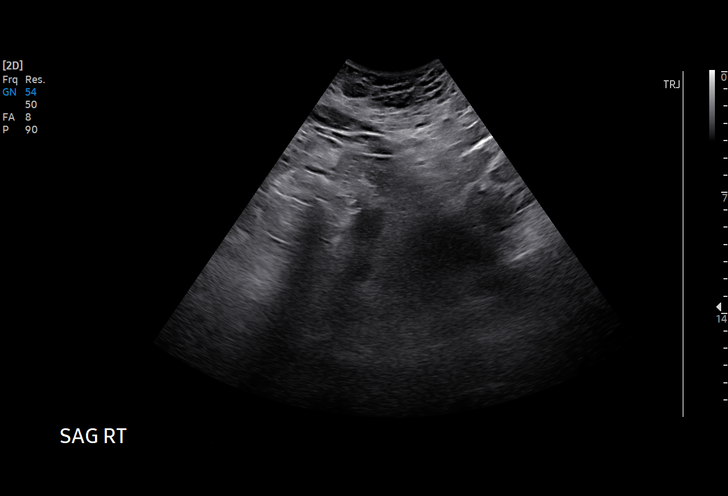
[im 19/82]
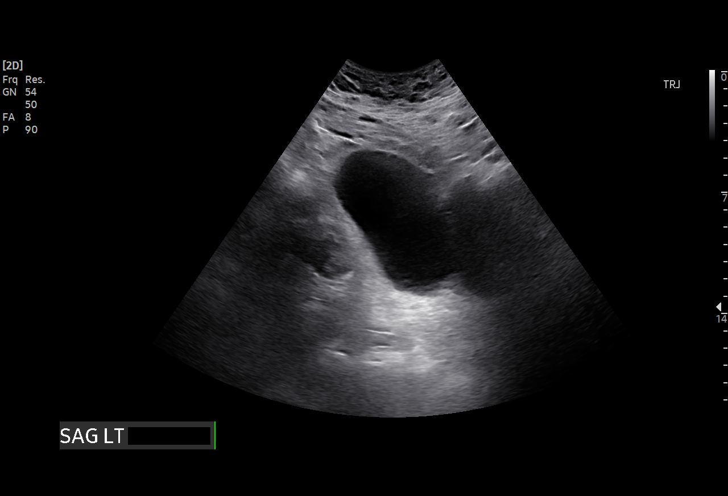
[im 25/82]
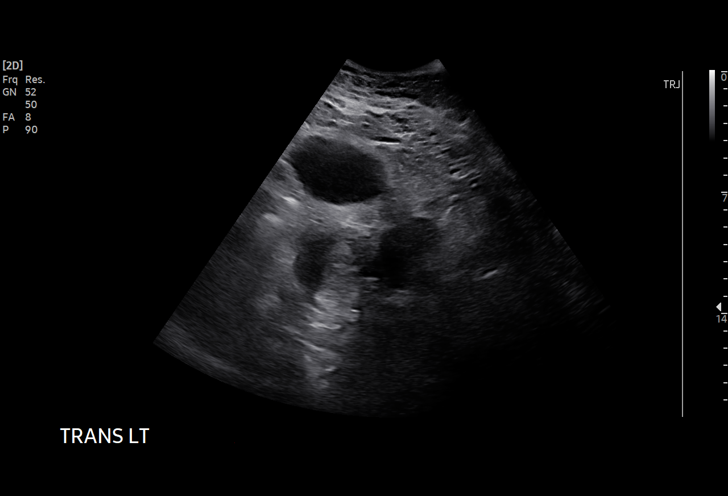
[im 31/82]
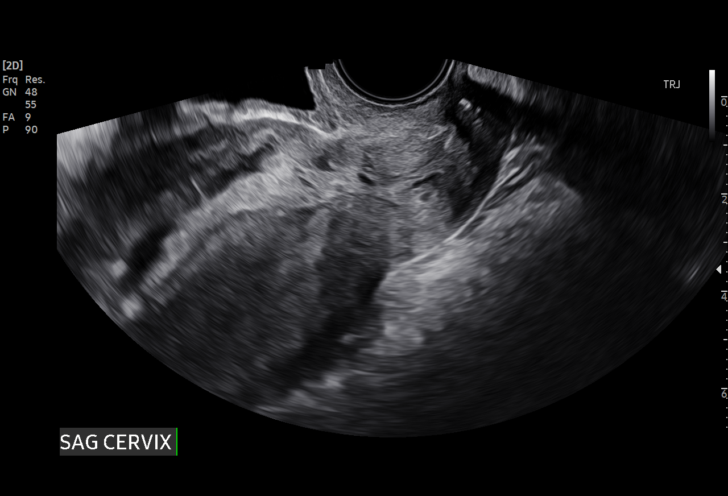
[im 37/82]
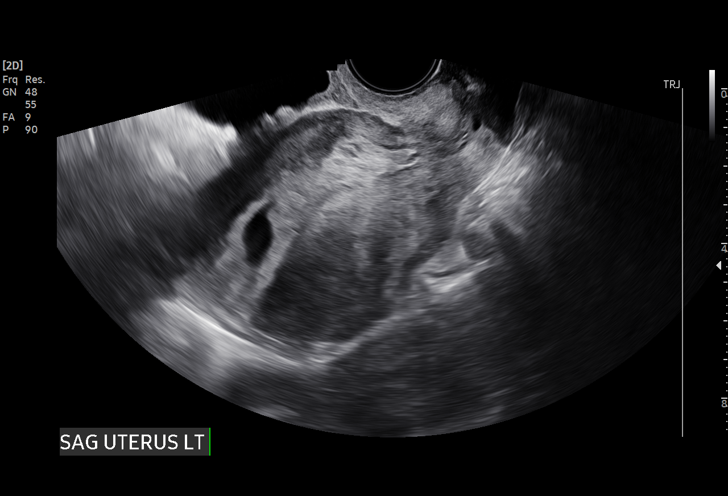
[im 43/82]
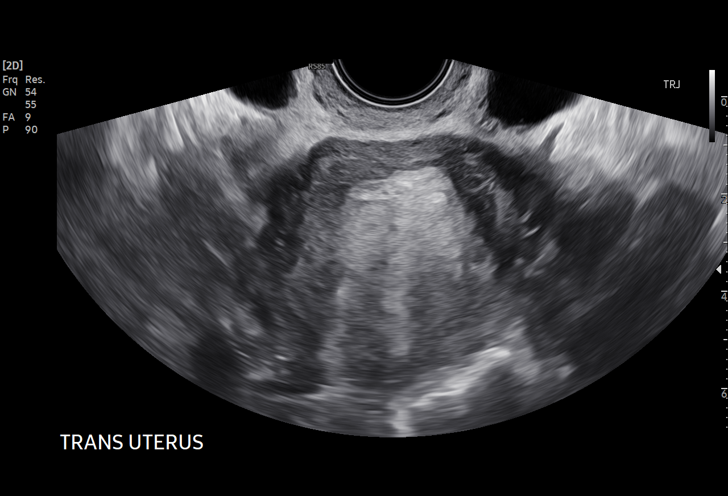
[im 46/82]
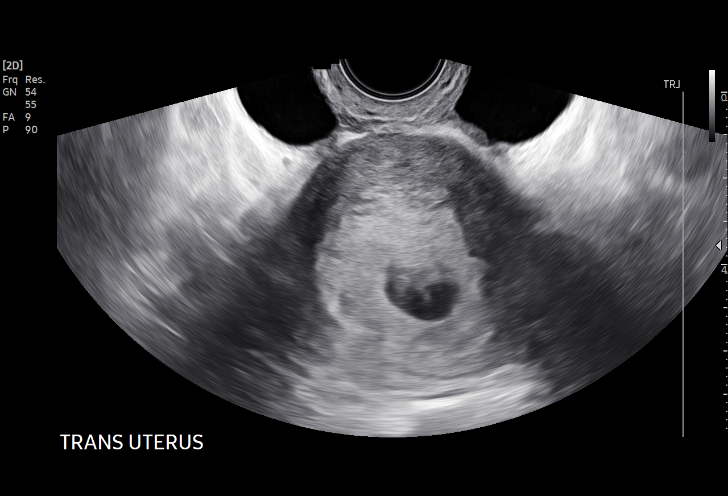
[im 52/82]
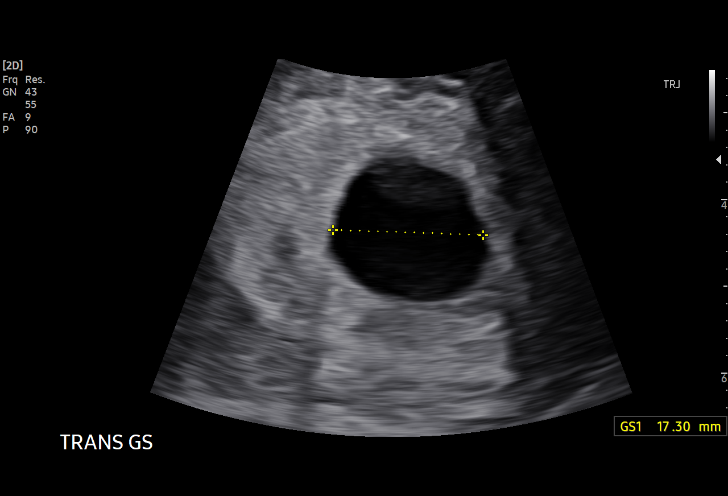
[im 58/82]
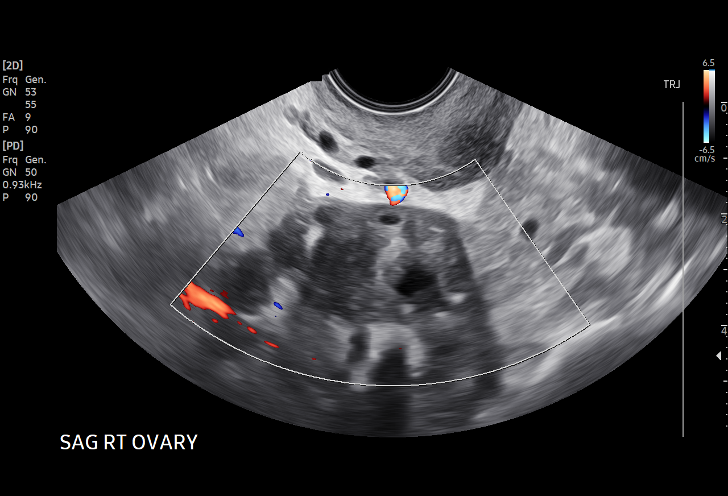
[im 64/82]
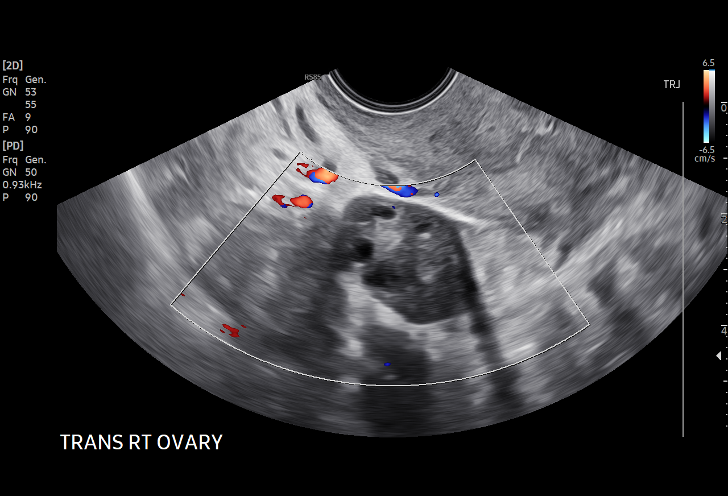
[im 70/82]
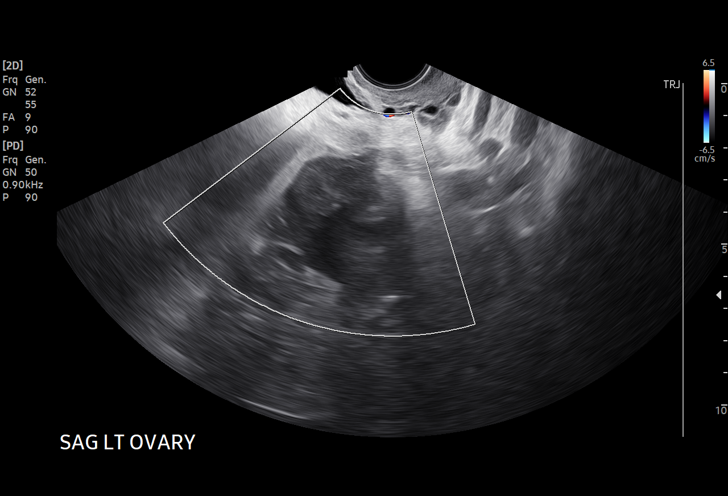
[im 76/82]
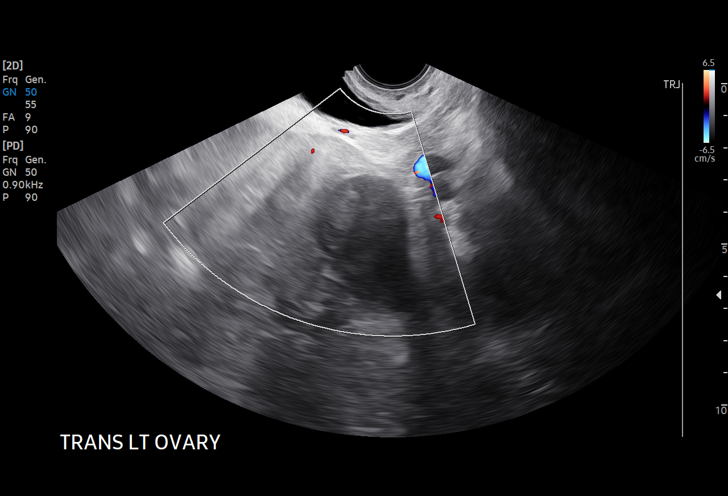
[im 82/82]
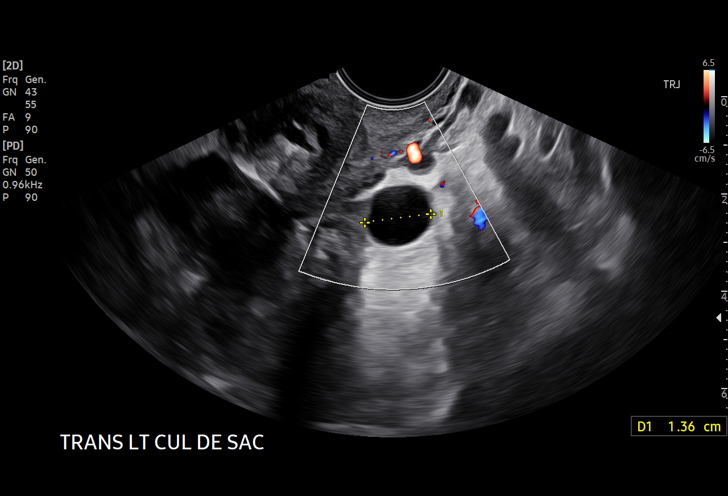

[15 of 28 positions shown; findings below may reference images not displayed]

FINDINGS: Intrauterine gestational sac: Present, single, irregular

Yolk sac:  Not identified

Embryo:  Not identified

Cardiac Activity: N/A

Heart Rate: N/A  bpm

MSD: 15.5 mm   6 w   2 d

CRL:    mm    w    d                  US EDC:

Subchorionic hemorrhage:  None visualized.

Maternal uterus/adnexae:

Uterus anteverted, otherwise unremarkable.

Trace free pelvic fluid.

Ovaries unremarkable.

No adnexal masses.
IMPRESSION: Gestational sac within uterus without visualization of a yolk sac or
fetal pole.

Findings are suspicious but not yet definitive for failed pregnancy.
Recommend follow-up US in 10-14 days for definitive diagnosis. This
recommendation follows SRU consensus guidelines: Diagnostic Criteria
for Nonviable Pregnancy Early in the First Trimester. N Engl J Med

## 2023-10-24 DIAGNOSIS — Z419 Encounter for procedure for purposes other than remedying health state, unspecified: Secondary | ICD-10-CM | POA: Diagnosis not present
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
## 2023-11-16 ENCOUNTER — Encounter (HOSPITAL_COMMUNITY): Payer: Self-pay | Admitting: Psychiatry

## 2023-11-16 ENCOUNTER — Telehealth (HOSPITAL_COMMUNITY): Admitting: Psychiatry

## 2023-11-16 DIAGNOSIS — Z8659 Personal history of other mental and behavioral disorders: Secondary | ICD-10-CM

## 2023-11-16 DIAGNOSIS — Z8759 Personal history of other complications of pregnancy, childbirth and the puerperium: Secondary | ICD-10-CM

## 2023-11-16 DIAGNOSIS — F902 Attention-deficit hyperactivity disorder, combined type: Secondary | ICD-10-CM

## 2023-11-16 NOTE — Patient Instructions (Signed)
 Thank you for attending your appointment today.  -- We did not make any medication changes today. Please continue medications as prescribed. -- As you will be transitioning to a new psychiatrist in [place], we did not schedule follow up. Please call the clinic if your plans change and you desire medication management or therapy in the future.  -- You can have your medical records sent to your new provider by following the directions on this link:  [email or web address]  Please do not make any changes to medications without first discussing with your provider. If you are experiencing a psychiatric emergency, please call 911 or present to your nearest emergency department. Additional crisis, medication management, and therapy resources are included below.  [facility]  [address] [place], [place] [address] [phone] WALK-IN URGENT CARE 24/7 FOR ANYONE [address], [place], [place]  [phone] Fax: [phone] [email or web address] *Interpreters available *Accepts all insurance and uninsured for Urgent Care needs *Accepts Medicaid and uninsured for outpatient treatment (below)      ONLY FOR [facility]  Below:    Outpatient New Patient Assessment/Therapy Walk-ins:        Monday, Wednesday, and Thursday 8am until slots are full (first come, first served)                   New Patient Psychiatry/Medication Management        Monday-Friday 8am-11am (first come, first served)               For all walk-ins we ask that you arrive by 7:15am, because patients will be seen in the order of arrival.

## 2023-11-24 DIAGNOSIS — Z419 Encounter for procedure for purposes other than remedying health state, unspecified: Secondary | ICD-10-CM | POA: Diagnosis not present

## 2023-12-03 IMAGING — US US OB < 14 WEEKS - US OB TV
1 series · 13 of 28 positions shown · non-contrast
Comparison: None.

CLINICAL DATA: Right-sided abdominal pain with vaginal bleeding

EXAM:
OBSTETRIC <14 WK US AND TRANSVAGINAL OB US
TECHNIQUE: Both transabdominal and transvaginal ultrasound examinations were
performed for complete evaluation of the gestation as well as the
maternal uterus, adnexal regions, and pelvic cul-de-sac.
Transvaginal technique was performed to assess early pregnancy.

[Series 1: us ob less than 14 weeks with ob transvaginal · 116 acquisitions, 13 frames shown]
[im 5/116]
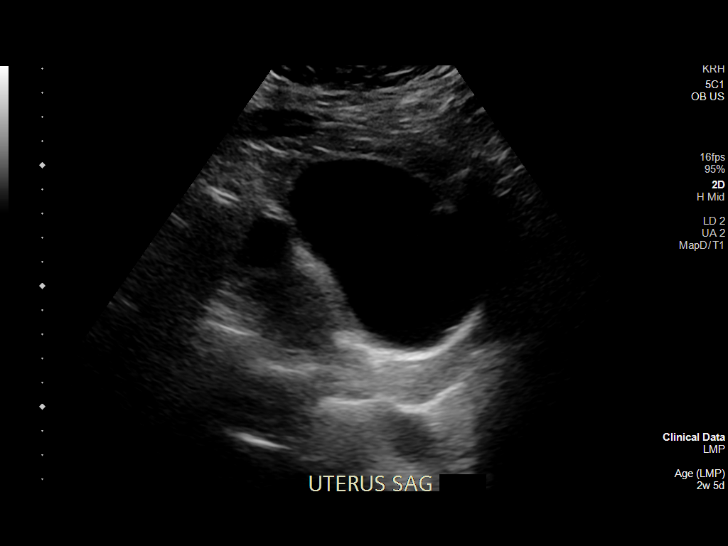
[im 13/116]
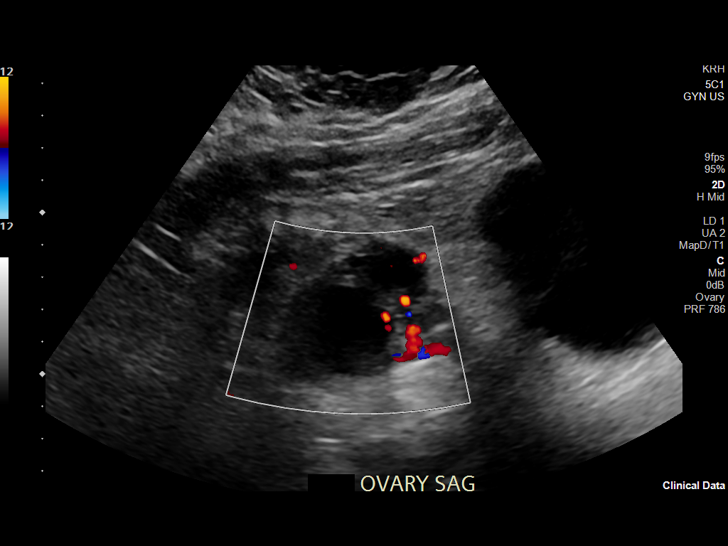
[im 22/116]
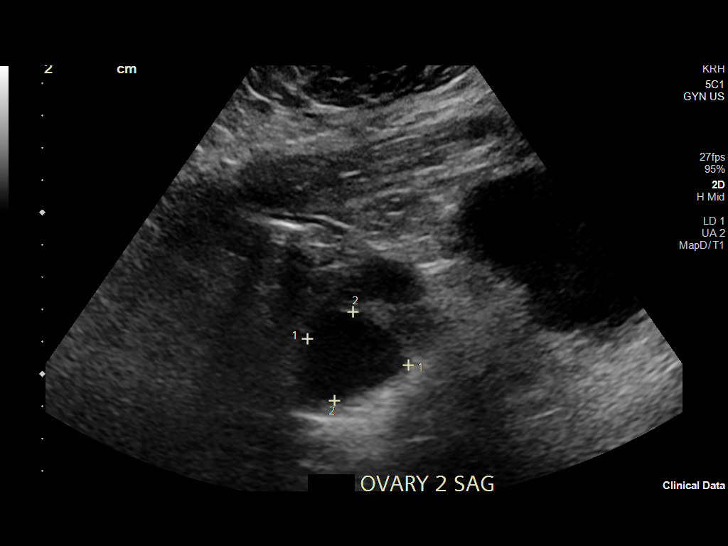
[im 30/116]
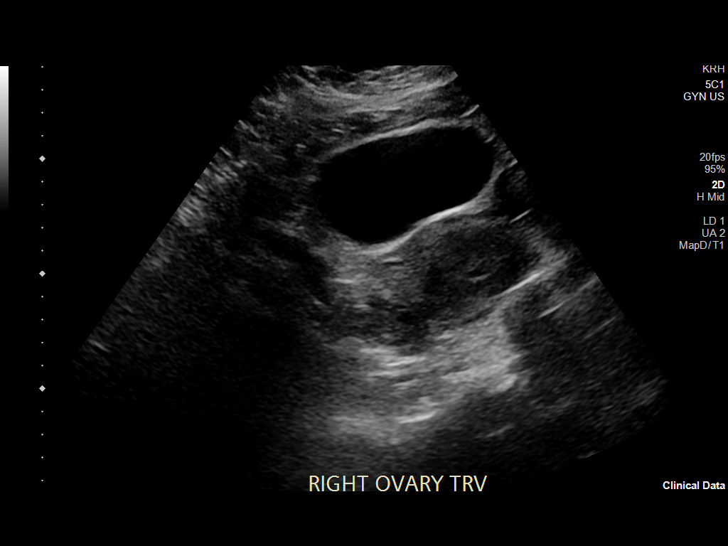
[im 39/116]
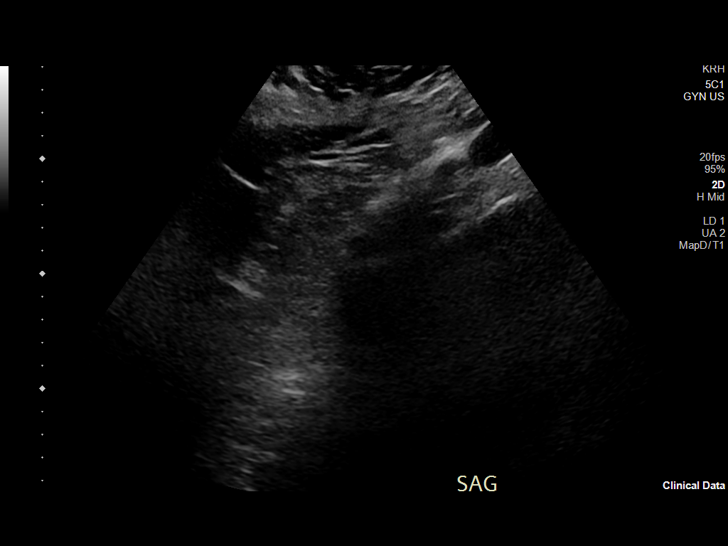
[im 47/116]
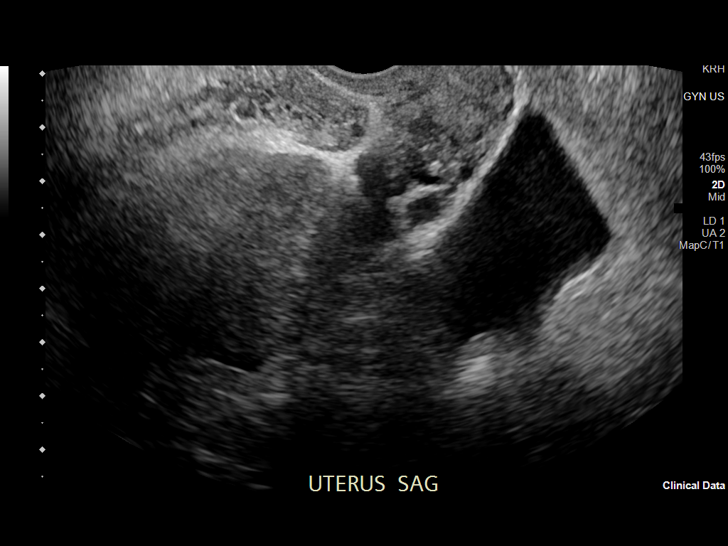
[im 60/116]
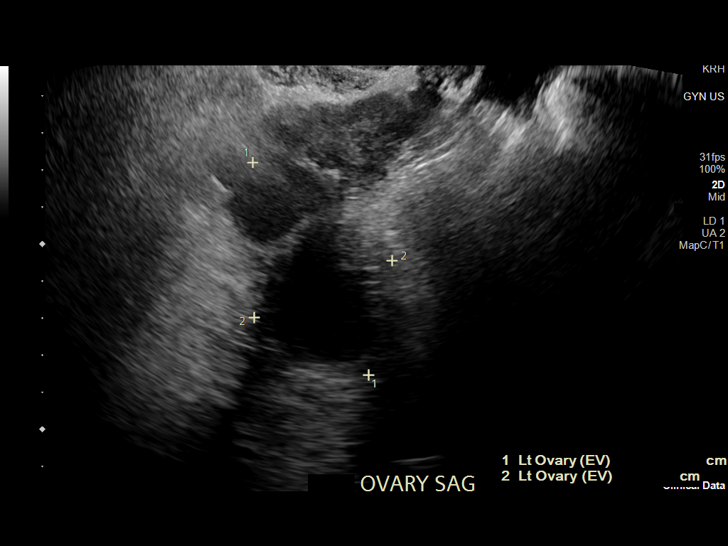
[im 69/116]
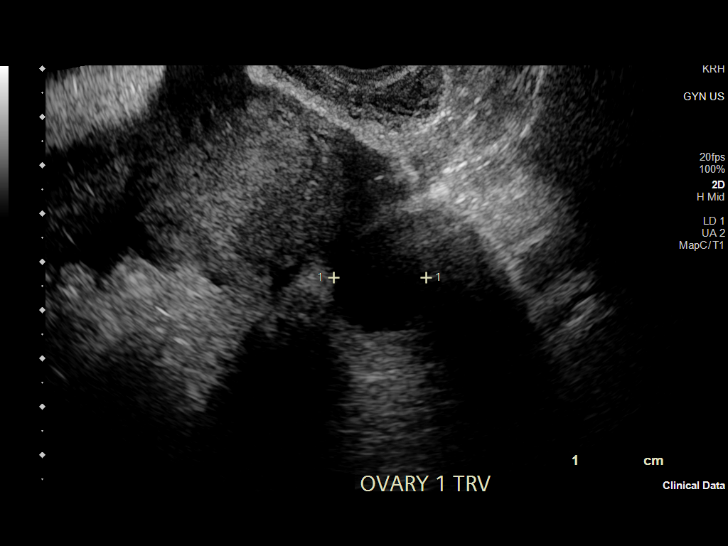
[im 77/116]
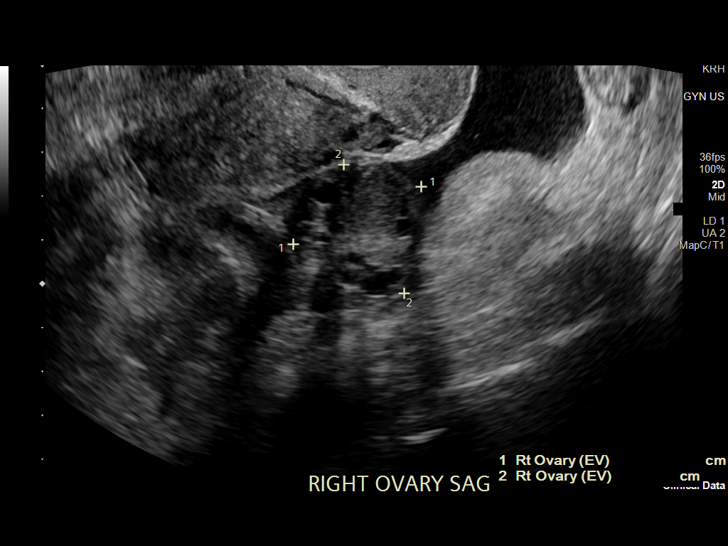
[im 86/116]
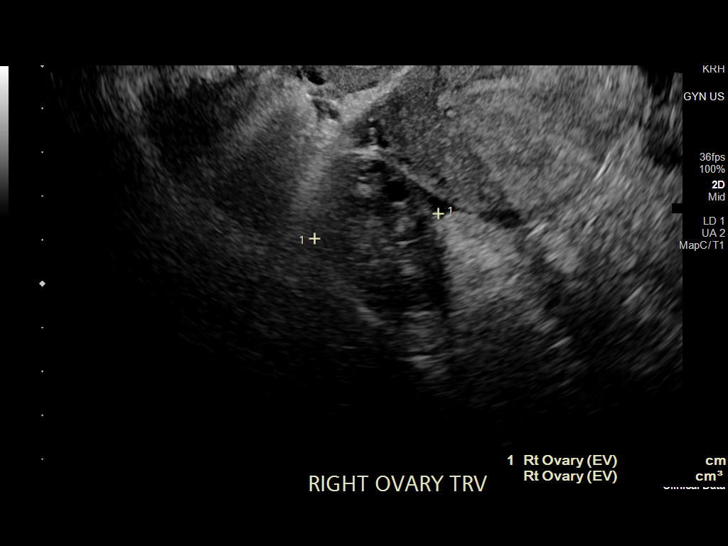
[im 94/116]
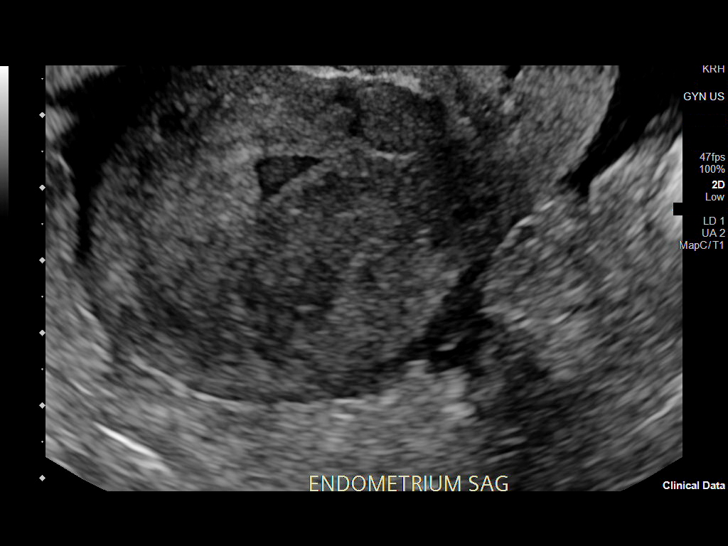
[im 103/116]
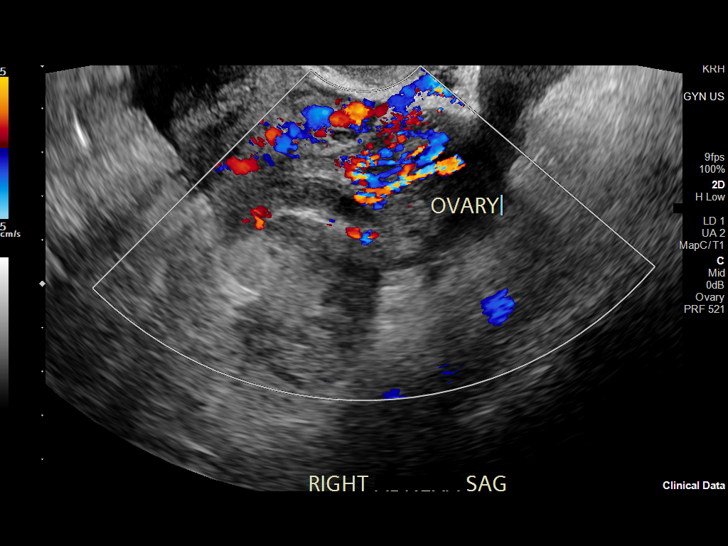
[im 111/116]
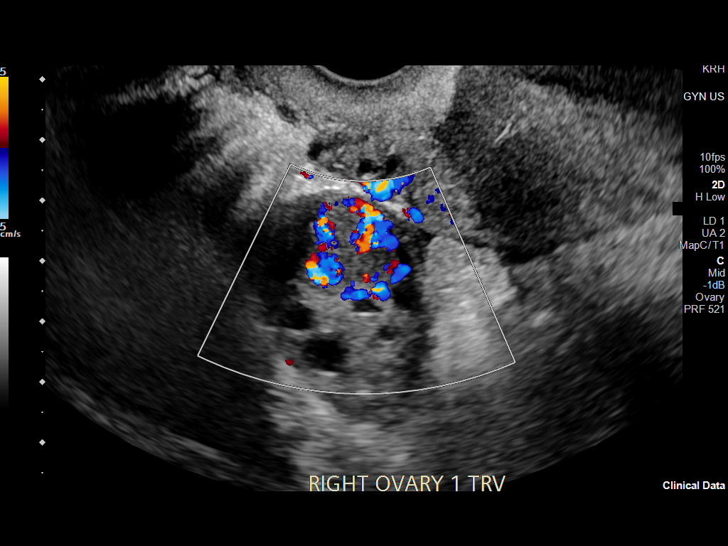

[13 of 28 positions shown; findings below may reference images not displayed]

FINDINGS: Intrauterine gestational sac: None mildly complex fluid in the
endometrial canal.

Yolk sac:  Not Visualized.

Embryo:  Not Visualized.

Maternal uterus/adnexae: Right ovary measures 2.9 by 3.2 x 3.2 cm.
Left ovary measures 3.2 by 6.5 x 4 cm. Multiple anechoic cysts in
the left ovary measuring up to 3.2 cm. Heterogenous masslike tissue
in the right adnexa with vascular flow. This measures 4 x 4.9 by
cm. There is moderate fluid in the pelvis.
IMPRESSION: 1. No IUP identified. Heterogenous masslike tissue in the right
adnexa with some flow, this measures 4.9 cm and is concerning for an
ectopic pregnancy. Moderate free fluid in the pelvis raises concern
for ruptured ectopic.

Critical Value/emergent results were called by telephone at the time
of interpretation on 02/26/2021 at [DATE] to provider FRICKE
RAMIAI , who verbally acknowledged these results.

## 2023-12-24 DIAGNOSIS — Z419 Encounter for procedure for purposes other than remedying health state, unspecified: Secondary | ICD-10-CM | POA: Diagnosis not present

## 2024-04-23 ENCOUNTER — Ambulatory Visit: Payer: Medicaid Other | Admitting: Dermatology
